# Patient Record
Sex: Female | Born: 1955 | Race: White | Hispanic: No | Marital: Married | Smoking: Never smoker
Health system: Southern US, Community
[De-identification: ages and names within clinical notes are randomized; demographics above are authoritative.]

## PROBLEM LIST (undated history)

## (undated) DIAGNOSIS — I7300 Raynaud's syndrome without gangrene: Secondary | ICD-10-CM

## (undated) DIAGNOSIS — M3500 Sicca syndrome, unspecified: Secondary | ICD-10-CM

## (undated) DIAGNOSIS — Z86018 Personal history of other benign neoplasm: Secondary | ICD-10-CM

## (undated) DIAGNOSIS — K219 Gastro-esophageal reflux disease without esophagitis: Secondary | ICD-10-CM

## (undated) DIAGNOSIS — R42 Dizziness and giddiness: Secondary | ICD-10-CM

## (undated) DIAGNOSIS — D169 Benign neoplasm of bone and articular cartilage, unspecified: Secondary | ICD-10-CM

## (undated) DIAGNOSIS — I10 Essential (primary) hypertension: Secondary | ICD-10-CM

## (undated) HISTORY — PX: GANGLION CYST EXCISION: SHX1691

---
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
## 1984-11-15 HISTORY — PX: TUBAL LIGATION: SHX77

## 1994-11-15 HISTORY — PX: CHOLECYSTECTOMY: SHX55

## 2003-11-16 DIAGNOSIS — Z86018 Personal history of other benign neoplasm: Secondary | ICD-10-CM

## 2003-11-16 HISTORY — DX: Personal history of other benign neoplasm: Z86.018

## 2004-08-27 ENCOUNTER — Ambulatory Visit: Payer: Self-pay | Admitting: Obstetrics and Gynecology

## 2005-09-02 ENCOUNTER — Ambulatory Visit: Payer: Self-pay | Admitting: Obstetrics and Gynecology

## 2005-09-17 ENCOUNTER — Ambulatory Visit: Payer: Self-pay | Admitting: Obstetrics and Gynecology

## 2005-11-11 ENCOUNTER — Ambulatory Visit: Payer: Self-pay | Admitting: Gastroenterology

## 2005-12-13 ENCOUNTER — Ambulatory Visit: Payer: Self-pay | Admitting: Obstetrics and Gynecology

## 2006-06-15 ENCOUNTER — Ambulatory Visit: Payer: Self-pay | Admitting: Family Medicine

## 2006-09-27 ENCOUNTER — Ambulatory Visit: Payer: Self-pay | Admitting: Obstetrics and Gynecology

## 2006-09-29 ENCOUNTER — Ambulatory Visit: Payer: Self-pay | Admitting: Obstetrics and Gynecology

## 2007-10-03 ENCOUNTER — Ambulatory Visit: Payer: Self-pay | Admitting: Family Medicine

## 2008-04-04 DIAGNOSIS — D239 Other benign neoplasm of skin, unspecified: Secondary | ICD-10-CM

## 2008-04-04 HISTORY — DX: Other benign neoplasm of skin, unspecified: D23.9

## 2008-10-03 ENCOUNTER — Ambulatory Visit: Payer: Self-pay | Admitting: Obstetrics and Gynecology

## 2009-10-06 ENCOUNTER — Ambulatory Visit: Payer: Self-pay | Admitting: Obstetrics and Gynecology

## 2010-08-25 ENCOUNTER — Ambulatory Visit: Payer: Self-pay | Admitting: Obstetrics and Gynecology

## 2011-08-26 ENCOUNTER — Ambulatory Visit: Payer: Self-pay | Admitting: Obstetrics and Gynecology

## 2012-08-30 ENCOUNTER — Ambulatory Visit: Payer: Self-pay | Admitting: Obstetrics and Gynecology

## 2013-08-23 ENCOUNTER — Ambulatory Visit: Payer: Self-pay | Admitting: Obstetrics and Gynecology

## 2014-09-03 ENCOUNTER — Ambulatory Visit: Payer: Self-pay | Admitting: Obstetrics and Gynecology

## 2015-09-11 ENCOUNTER — Other Ambulatory Visit: Payer: Self-pay | Admitting: Obstetrics and Gynecology

## 2015-09-11 DIAGNOSIS — Z1231 Encounter for screening mammogram for malignant neoplasm of breast: Secondary | ICD-10-CM

## 2015-09-16 ENCOUNTER — Ambulatory Visit
Admission: RE | Admit: 2015-09-16 | Discharge: 2015-09-16 | Disposition: A | Discharge status: Home / Self Care | Payer: BLUE CROSS/BLUE SHIELD | Source: Ambulatory Visit | Attending: Obstetrics and Gynecology | Admitting: Obstetrics and Gynecology

## 2015-09-16 DIAGNOSIS — Z1231 Encounter for screening mammogram for malignant neoplasm of breast: Secondary | ICD-10-CM | POA: Insufficient documentation

## 2016-09-07 ENCOUNTER — Other Ambulatory Visit: Payer: Self-pay | Admitting: Obstetrics and Gynecology

## 2016-09-07 DIAGNOSIS — Z1231 Encounter for screening mammogram for malignant neoplasm of breast: Secondary | ICD-10-CM

## 2016-09-08 ENCOUNTER — Ambulatory Visit: Payer: BLUE CROSS/BLUE SHIELD

## 2016-09-09 ENCOUNTER — Other Ambulatory Visit: Payer: Self-pay | Admitting: Orthopedic Surgery

## 2016-09-09 DIAGNOSIS — M23312 Other meniscus derangements, anterior horn of medial meniscus, left knee: Secondary | ICD-10-CM

## 2016-09-20 ENCOUNTER — Other Ambulatory Visit: Payer: Self-pay | Admitting: Orthopedic Surgery

## 2016-09-20 DIAGNOSIS — M23312 Other meniscus derangements, anterior horn of medial meniscus, left knee: Secondary | ICD-10-CM

## 2016-09-21 ENCOUNTER — Ambulatory Visit: Payer: BLUE CROSS/BLUE SHIELD

## 2016-09-22 ENCOUNTER — Ambulatory Visit
Admission: RE | Admit: 2016-09-22 | Discharge: 2016-09-22 | Disposition: A | Discharge status: Home / Self Care | Payer: BLUE CROSS/BLUE SHIELD | Source: Ambulatory Visit | Attending: Obstetrics and Gynecology | Admitting: Obstetrics and Gynecology

## 2016-09-22 ENCOUNTER — Ambulatory Visit: Payer: BLUE CROSS/BLUE SHIELD

## 2016-09-22 DIAGNOSIS — Z1231 Encounter for screening mammogram for malignant neoplasm of breast: Secondary | ICD-10-CM | POA: Diagnosis present

## 2016-09-30 ENCOUNTER — Ambulatory Visit: Admission: RE | Admit: 2016-09-30 | Payer: BLUE CROSS/BLUE SHIELD | Source: Ambulatory Visit

## 2016-10-12 ENCOUNTER — Ambulatory Visit: Payer: BLUE CROSS/BLUE SHIELD

## 2016-11-30 ENCOUNTER — Encounter: Payer: Self-pay | Admitting: *Deleted

## 2016-12-03 NOTE — Discharge Instructions (Signed)
Cataract Surgery, Care After °Refer to this sheet in the next few weeks. These instructions provide you with information about caring for yourself after your procedure. Your health care provider may also give you more specific instructions. Your treatment has been planned according to current medical practices, but problems sometimes occur. Call your health care provider if you have any problems or questions after your procedure. °What can I expect after the procedure? °After the procedure, it is common to have: °· Itching. °· Discomfort. °· Fluid discharge. °· Sensitivity to light and to touch. °· Bruising. °Follow these instructions at home: °Eye Care  °· Check your eye every day for signs of infection. Watch for: °¨ Redness, swelling, or pain. °¨ Fluid, blood, or pus. °¨ Warmth. °¨ Bad smell. °Activity  °· Avoid strenuous activities, such as playing contact sports, for as long as told by your health care provider. °· Do not drive or operate heavy machinery until your health care provider approves. °· Do not bend or lift heavy objects . Bending increases pressure in the eye. You can walk, climb stairs, and do light household chores. °· Ask your health care provider when you can return to work. If you work in a dusty environment, you may be advised to wear protective eyewear for a period of time. °General instructions  °· Take or apply over-the-counter and prescription medicines only as told by your health care provider. This includes eye drops. °· Do not touch or rub your eyes. °· If you were given a protective shield, wear it as told by your health care provider. If you were not given a protective shield, wear sunglasses as told by your health care provider to protect your eyes. °· Keep the area around your eye clean and dry. Avoid swimming or allowing water to hit you directly in the face while showering until told by your health care provider. Keep soap and shampoo out of your eyes. °· Do not put a contact lens  into the affected eye or eyes until your health care provider approves. °· Keep all follow-up visits as told by your health care provider. This is important. °Contact a health care provider if: ° °· You have increased bruising around your eye. °· You have pain that is not helped with medicine. °· You have a fever. °· You have redness, swelling, or pain in your eye. °· You have fluid, blood, or pus coming from your incision. °· Your vision gets worse. °Get help right away if: °· You have sudden vision loss. °This information is not intended to replace advice given to you by your health care provider. Make sure you discuss any questions you have with your health care provider. °Document Released: 05/21/2005 Document Revised: 03/11/2016 Document Reviewed: 09/11/2015 °Elsevier Interactive Patient Education © 2017 [employer] ° ° ° ° °General Anesthesia, Adult, Care After °These instructions provide you with information about caring for yourself after your procedure. Your health care provider may also give you more specific instructions. Your treatment has been planned according to current medical practices, but problems sometimes occur. Call your health care provider if you have any problems or questions after your procedure. °What can I expect after the procedure? °After the procedure, it is common to have: °· Vomiting. °· A sore throat. °· Mental slowness. °It is common to feel: °· Nauseous. °· Cold or shivery. °· Sleepy. °· Tired. °· Sore or achy, even in parts of your body where you did not have surgery. °Follow these instructions at   home: °For at least 24 hours after the procedure:  °· Do not: °¨ Participate in activities where you could fall or become injured. °¨ Drive. °¨ Use heavy machinery. °¨ Drink alcohol. °¨ Take sleeping pills or medicines that cause drowsiness. °¨ Make important decisions or sign legal documents. °¨ Take care of children on your own. °· Rest. °Eating and drinking  °· If you vomit, drink  water, juice, or soup when you can drink without vomiting. °· Drink enough fluid to keep your urine clear or pale yellow. °· Make sure you have little or no nausea before eating solid foods. °· Follow the diet recommended by your health care provider. °General instructions  °· Have a responsible adult stay with you until you are awake and alert. °· Return to your normal activities as told by your health care provider. Ask your health care provider what activities are safe for you. °· Take over-the-counter and prescription medicines only as told by your health care provider. °· If you smoke, do not smoke without supervision. °· Keep all follow-up visits as told by your health care provider. This is important. °Contact a health care provider if: °· You continue to have nausea or vomiting at home, and medicines are not helpful. °· You cannot drink fluids or start eating again. °· You cannot urinate after 8-12 hours. °· You develop a skin rash. °· You have fever. °· You have increasing redness at the site of your procedure. °Get help right away if: °· You have difficulty breathing. °· You have chest pain. °· You have unexpected bleeding. °· You feel that you are having a life-threatening or urgent problem. °This information is not intended to replace advice given to you by your health care provider. Make sure you discuss any questions you have with your health care provider. °Document Released: 02/07/2001 Document Revised: 04/05/2016 Document Reviewed: 10/16/2015 °Elsevier Interactive Patient Education © 2017 [employer] ° °

## 2016-12-07 ENCOUNTER — Ambulatory Visit
Admission: RE | Admit: 2016-12-07 | Discharge: 2016-12-07 | Disposition: A | Discharge status: Home / Self Care | Payer: BLUE CROSS/BLUE SHIELD | Source: Ambulatory Visit | Attending: Ophthalmology | Admitting: Ophthalmology

## 2016-12-07 ENCOUNTER — Ambulatory Visit: Payer: BLUE CROSS/BLUE SHIELD | Admitting: Anesthesiology

## 2016-12-07 ENCOUNTER — Encounter
Admission: RE | Disposition: A | Discharge status: Home / Self Care | Payer: Self-pay | Source: Ambulatory Visit | Attending: Ophthalmology

## 2016-12-07 DIAGNOSIS — I10 Essential (primary) hypertension: Secondary | ICD-10-CM | POA: Insufficient documentation

## 2016-12-07 DIAGNOSIS — Z7982 Long term (current) use of aspirin: Secondary | ICD-10-CM | POA: Diagnosis not present

## 2016-12-07 DIAGNOSIS — H2511 Age-related nuclear cataract, right eye: Secondary | ICD-10-CM | POA: Insufficient documentation

## 2016-12-07 DIAGNOSIS — Z79899 Other long term (current) drug therapy: Secondary | ICD-10-CM | POA: Insufficient documentation

## 2016-12-07 HISTORY — DX: Sjogren syndrome, unspecified: M35.00

## 2016-12-07 HISTORY — DX: Gastro-esophageal reflux disease without esophagitis: K21.9

## 2016-12-07 HISTORY — DX: Benign neoplasm of bone and articular cartilage, unspecified: D16.9

## 2016-12-07 HISTORY — DX: Dizziness and giddiness: R42

## 2016-12-07 HISTORY — PX: CATARACT EXTRACTION W/PHACO: SHX586

## 2016-12-07 HISTORY — DX: Essential (primary) hypertension: I10

## 2016-12-07 HISTORY — DX: Raynaud's syndrome without gangrene: I73.00

## 2016-12-07 SURGERY — PHACOEMULSIFICATION, CATARACT, WITH IOL INSERTION
Anesthesia: Monitor Anesthesia Care | Laterality: Right | Wound class: Clean

## 2016-12-07 MED ORDER — SODIUM HYALURONATE 10 MG/ML IO SOLN
INTRAOCULAR | Status: DC | PRN
  Administered 2016-12-07: .55 mL via INTRAOCULAR

## 2016-12-07 MED ORDER — ARMC OPHTHALMIC DILATING DROPS
1.0000 "application " | OPHTHALMIC | Status: DC | PRN
  Administered 2016-12-07 (×3): 1 via OPHTHALMIC

## 2016-12-07 MED ORDER — MOXIFLOXACIN HCL 0.5 % OP SOLN
OPHTHALMIC | Status: DC | PRN
  Administered 2016-12-07: 1 [drp] via OPHTHALMIC

## 2016-12-07 MED ORDER — FENTANYL CITRATE (PF) 100 MCG/2ML IJ SOLN
INTRAMUSCULAR | Status: DC | PRN
  Administered 2016-12-07: 50 ug via INTRAVENOUS

## 2016-12-07 MED ORDER — MIDAZOLAM HCL 2 MG/2ML IJ SOLN
INTRAMUSCULAR | Status: DC | PRN
  Administered 2016-12-07: 2 mg via INTRAVENOUS

## 2016-12-07 MED ORDER — LACTATED RINGERS IV SOLN: INTRAVENOUS | Status: DC

## 2016-12-07 MED ORDER — SODIUM HYALURONATE 23 MG/ML IO SOLN
INTRAOCULAR | Status: DC | PRN
  Administered 2016-12-07: 0.6 mL via INTRAOCULAR

## 2016-12-07 MED ORDER — EPINEPHRINE PF 1 MG/ML IJ SOLN
INTRAOCULAR | Status: DC | PRN
  Administered 2016-12-07: 58 mL via OPHTHALMIC

## 2016-12-07 MED ORDER — LIDOCAINE HCL (PF) 4 % IJ SOLN
INTRAOCULAR | Status: DC | PRN
  Administered 2016-12-07: 1.5 mL via OPHTHALMIC

## 2016-12-07 SURGICAL SUPPLY — 19 items
CANNULA ANT/CHMB 27GA (MISCELLANEOUS) ×3 IMPLANT
CUP MEDICINE 2OZ PLAST GRAD ST (MISCELLANEOUS) ×3 IMPLANT
DISSECTOR HYDRO NUCLEUS 50X22 (MISCELLANEOUS) ×3 IMPLANT
GLOVE BIO SURGEON STRL SZ8 (GLOVE) ×3 IMPLANT
GLOVE SURG LX 7.5 STRW (GLOVE) ×2
GLOVE SURG LX STRL 7.5 STRW (GLOVE) ×1 IMPLANT
GOWN STRL REUS W/ TWL LRG LVL3 (GOWN DISPOSABLE) ×2 IMPLANT
GOWN STRL REUS W/TWL LRG LVL3 (GOWN DISPOSABLE) ×4
LENS IOL TECNIS ITEC 21.0 (Intraocular Lens) ×3 IMPLANT
MARKER SKIN DUAL TIP RULER LAB (MISCELLANEOUS) ×3 IMPLANT
PACK CATARACT (MISCELLANEOUS) ×3 IMPLANT
PACK CATARACT BRASINGTON (MISCELLANEOUS) ×3 IMPLANT
PACK EYE AFTER SURG (MISCELLANEOUS) ×3 IMPLANT
SOL PREP PVP 2OZ (MISCELLANEOUS) ×3
SOLUTION PREP PVP 2OZ (MISCELLANEOUS) ×1 IMPLANT
SYR 3ML LL SCALE MARK (SYRINGE) ×3 IMPLANT
SYR TB 1ML LUER SLIP (SYRINGE) ×3 IMPLANT
WATER STERILE IRR 250ML POUR (IV SOLUTION) ×3 IMPLANT
WIPE NON LINTING 3.25X3.25 (MISCELLANEOUS) ×3 IMPLANT

## 2016-12-07 NOTE — Transfer of Care (Signed)
Immediate Anesthesia Transfer of Care Note  Patient: Joann Baxter  Procedure(s) Performed: Procedure(s): CATARACT EXTRACTION PHACO AND INTRAOCULAR LENS PLACEMENT (IOC)  right (Right)  Patient Location: PACU  Anesthesia Type: MAC  Level of Consciousness: awake, alert  and patient cooperative  Airway and Oxygen Therapy: Patient Spontanous Breathing and Patient connected to supplemental oxygen  Post-op Assessment: Post-op Vital signs reviewed, Patient's Cardiovascular Status Stable, Respiratory Function Stable, Patent Airway and No signs of Nausea or vomiting  Post-op Vital Signs: Reviewed and stable  Complications: No apparent anesthesia complications

## 2016-12-07 NOTE — H&P (Signed)
The History and Physical notes are on paper, have been signed, and are to be scanned. The patient remains stable and unchanged from the H&P.   Previous H&P reviewed, patient examined, and there are no changes.  Joann Baxter 12/07/2016 9:25 AM

## 2016-12-07 NOTE — Anesthesia Preprocedure Evaluation (Addendum)
Anesthesia Evaluation  Patient identified by MRN, date of birth, ID band Patient awake    Reviewed: Allergy & Precautions, H&P , NPO status , Patient's Chart, lab work & pertinent test results, reviewed documented beta blocker date and time   Airway Mallampati: I  TM Distance: >3 FB Neck ROM: full    Dental no notable dental hx.    Pulmonary neg pulmonary ROS,    Pulmonary exam normal breath sounds clear to auscultation       Cardiovascular Exercise Tolerance: Good hypertension, + Peripheral Vascular Disease  Normal cardiovascular exam Rhythm:regular Rate:Normal  Hx of Raynaud's disease   Neuro/Psych negative neurological ROS  negative psych ROS   GI/Hepatic Neg liver ROS, GERD  Controlled,  Endo/Other  negative endocrine ROS  Renal/GU negative Renal ROS  negative genitourinary   Musculoskeletal   Abdominal   Peds  Hematology negative hematology ROS (+)   Anesthesia Other Findings   Reproductive/Obstetrics negative OB ROS                            Anesthesia Physical Anesthesia Plan  ASA: II  Anesthesia Plan: MAC   Post-op Pain Management:    Induction:   Airway Management Planned:   Additional Equipment:   Intra-op Plan:   Post-operative Plan:   Informed Consent: I have reviewed the patients History and Physical, chart, labs and discussed the procedure including the risks, benefits and alternatives for the proposed anesthesia with the patient or authorized representative who has indicated his/her understanding and acceptance.   Dental Advisory Given  Plan Discussed with: CRNA  Anesthesia Plan Comments:        Anesthesia Quick Evaluation

## 2016-12-07 NOTE — Anesthesia Postprocedure Evaluation (Signed)
Anesthesia Post Note  Patient: Joann Baxter  Procedure(s) Performed: Procedure(s) (LRB): CATARACT EXTRACTION PHACO AND INTRAOCULAR LENS PLACEMENT (IOC)  right (Right)  Patient location during evaluation: PACU Anesthesia Type: MAC Level of consciousness: awake and alert Pain management: pain level controlled Vital Signs Assessment: post-procedure vital signs reviewed and stable Respiratory status: spontaneous breathing, nonlabored ventilation and respiratory function stable Cardiovascular status: stable and blood pressure returned to baseline Anesthetic complications: no    Trecia Rogers

## 2016-12-07 NOTE — Anesthesia Procedure Notes (Signed)
Procedure Name: MAC Performed by: Mayme Genta Pre-anesthesia Checklist: Patient identified, Emergency Drugs available, Suction available, Timeout performed and Patient being monitored Patient Re-evaluated:Patient Re-evaluated prior to inductionOxygen Delivery Method: Nasal cannula Placement Confirmation: positive ETCO2

## 2016-12-07 NOTE — Op Note (Signed)
OPERATIVE NOTE  Joann Baxter [id number] 12/07/2016   PREOPERATIVE DIAGNOSIS:  Nuclear sclerotic cataract right eye.  H25.11   POSTOPERATIVE DIAGNOSIS:    Nuclear sclerotic cataract right eye.     PROCEDURE:  Phacoemusification with posterior chamber intraocular lens placement of the right eye   LENS:   Implant Name Type Inv. Item Serial No. Manufacturer Lot No. LRB No. Used  LENS IOL DIOP 21.0 - [id number] Intraocular Lens LENS IOL DIOP 21.0 [id number]   Right 1       PCB00 +21.0   ULTRASOUND TIME: 0 minutes 26 seconds.  CDE 4.56   SURGEON:  Benay Pillow, MD, MPH  ANESTHESIOLOGIST: Anesthesiologist: Rochel Brome, MD CRNA: Mayme Genta, CRNA   ANESTHESIA:  Topical with tetracaine drops augmented with 1% preservative-free intracameral lidocaine.  ESTIMATED BLOOD LOSS: less than 1 mL.   COMPLICATIONS:  None.   DESCRIPTION OF PROCEDURE:  The patient was identified in the holding room and transported to the operating room and placed in the supine position under the operating microscope.  The right eye was identified as the operative eye and it was prepped and draped in the usual sterile ophthalmic fashion.   A 1.0 millimeter clear-corneal paracentesis was made at the 10:30 position. 0.5 ml of preservative-free 1% lidocaine with epinephrine was injected into the anterior chamber.  The anterior chamber was filled with Healon 5 viscoelastic.  A 2.4 millimeter keratome was used to make a near-clear corneal incision at the 8:00 position.  A curvilinear capsulorrhexis was made with a cystotome and capsulorrhexis forceps.  Balanced salt solution was used to hydrodissect and hydrodelineate the nucleus.   Phacoemulsification was then used in stop and chop fashion to remove the lens nucleus and epinucleus.  The remaining cortex was then removed using the irrigation and aspiration handpiece. Healon was then placed into the capsular bag to distend it for lens placement.  A lens was then  injected into the capsular bag.  The remaining viscoelastic was aspirated.   Wounds were hydrated with balanced salt solution.  The anterior chamber was inflated to a physiologic pressure with balanced salt solution.   The rhexis was relatively large but the lens was well centered and positioned at the end of the case.  Intracameral vigamox 0.1 mL undiluted was injected into the eye and a drop placed onto the ocular surface.  No wound leaks were noted.  The patient was taken to the recovery room in stable condition without complications of anesthesia or surgery  Benay Pillow 12/07/2016, 10:05 AM

## 2016-12-08 ENCOUNTER — Encounter: Payer: Self-pay | Admitting: Ophthalmology

## 2016-12-27 ENCOUNTER — Encounter: Payer: Self-pay | Admitting: *Deleted

## 2017-01-06 ENCOUNTER — Ambulatory Visit
Admission: RE | Admit: 2017-01-06 | Discharge: 2017-01-06 | Disposition: A | Discharge status: Home / Self Care | Payer: BLUE CROSS/BLUE SHIELD | Source: Ambulatory Visit | Attending: Ophthalmology | Admitting: Ophthalmology

## 2017-01-06 ENCOUNTER — Ambulatory Visit: Payer: BLUE CROSS/BLUE SHIELD | Admitting: Anesthesiology

## 2017-01-06 ENCOUNTER — Encounter: Payer: Self-pay | Admitting: *Deleted

## 2017-01-06 ENCOUNTER — Encounter
Admission: RE | Disposition: A | Discharge status: Home / Self Care | Payer: Self-pay | Source: Ambulatory Visit | Attending: Ophthalmology

## 2017-01-06 DIAGNOSIS — K219 Gastro-esophageal reflux disease without esophagitis: Secondary | ICD-10-CM | POA: Insufficient documentation

## 2017-01-06 DIAGNOSIS — I739 Peripheral vascular disease, unspecified: Secondary | ICD-10-CM | POA: Insufficient documentation

## 2017-01-06 DIAGNOSIS — H2512 Age-related nuclear cataract, left eye: Secondary | ICD-10-CM | POA: Diagnosis not present

## 2017-01-06 DIAGNOSIS — Z7982 Long term (current) use of aspirin: Secondary | ICD-10-CM | POA: Insufficient documentation

## 2017-01-06 DIAGNOSIS — H52229 Regular astigmatism, unspecified eye: Secondary | ICD-10-CM | POA: Diagnosis not present

## 2017-01-06 DIAGNOSIS — I10 Essential (primary) hypertension: Secondary | ICD-10-CM | POA: Insufficient documentation

## 2017-01-06 DIAGNOSIS — Z79899 Other long term (current) drug therapy: Secondary | ICD-10-CM | POA: Insufficient documentation

## 2017-01-06 HISTORY — PX: CATARACT EXTRACTION W/PHACO: SHX586

## 2017-01-06 SURGERY — PHACOEMULSIFICATION, CATARACT, WITH IOL INSERTION
Anesthesia: Monitor Anesthesia Care | Site: Eye | Laterality: Left | Wound class: Clean

## 2017-01-06 MED ORDER — SODIUM HYALURONATE 10 MG/ML IO SOLN
INTRAOCULAR | Status: AC
  Filled 2017-01-06: qty 0.85

## 2017-01-06 MED ORDER — MOXIFLOXACIN HCL 0.5 % OP SOLN
OPHTHALMIC | Status: DC | PRN
  Administered 2017-01-06: 9 mL via OPHTHALMIC

## 2017-01-06 MED ORDER — SODIUM HYALURONATE 23 MG/ML IO SOLN
INTRAOCULAR | Status: DC | PRN
  Administered 2017-01-06: 0.6 mL via INTRAOCULAR

## 2017-01-06 MED ORDER — SODIUM HYALURONATE 23 MG/ML IO SOLN
INTRAOCULAR | Status: AC
  Filled 2017-01-06: qty 0.6

## 2017-01-06 MED ORDER — SODIUM HYALURONATE 10 MG/ML IO SOLN
INTRAOCULAR | Status: DC | PRN
  Administered 2017-01-06: 0.55 mL via INTRAOCULAR

## 2017-01-06 MED ORDER — MOXIFLOXACIN HCL 0.5 % OP SOLN: 1.0000 [drp] | Freq: Once | OPHTHALMIC | Status: DC

## 2017-01-06 MED ORDER — POVIDONE-IODINE 5 % OP SOLN
OPHTHALMIC | Status: AC
  Filled 2017-01-06: qty 30

## 2017-01-06 MED ORDER — PROPARACAINE HCL 0.5 % OP SOLN
OPHTHALMIC | Status: AC
  Filled 2017-01-06: qty 15

## 2017-01-06 MED ORDER — ARMC OPHTHALMIC DILATING DROPS
1.0000 "application " | OPHTHALMIC | Status: AC
  Administered 2017-01-06 (×3): 1 via OPHTHALMIC

## 2017-01-06 MED ORDER — FENTANYL CITRATE (PF) 100 MCG/2ML IJ SOLN
INTRAMUSCULAR | Status: DC | PRN
  Administered 2017-01-06 (×2): 50 ug via INTRAVENOUS

## 2017-01-06 MED ORDER — BSS IO SOLN
INTRAOCULAR | Status: DC | PRN
  Administered 2017-01-06: 200 mL via INTRAOCULAR

## 2017-01-06 MED ORDER — EPINEPHRINE PF 1 MG/ML IJ SOLN
INTRAMUSCULAR | Status: AC
  Filled 2017-01-06: qty 1

## 2017-01-06 MED ORDER — ARMC OPHTHALMIC DILATING DROPS
OPHTHALMIC | Status: AC
  Filled 2017-01-06: qty 0.4

## 2017-01-06 MED ORDER — LIDOCAINE HCL (PF) 4 % IJ SOLN
INTRAOCULAR | Status: DC | PRN
  Administered 2017-01-06: 4 mL via OPHTHALMIC

## 2017-01-06 MED ORDER — MIDAZOLAM HCL 2 MG/2ML IJ SOLN
INTRAMUSCULAR | Status: AC
  Filled 2017-01-06: qty 2

## 2017-01-06 MED ORDER — MOXIFLOXACIN HCL 0.5 % OP SOLN
OPHTHALMIC | Status: AC
  Filled 2017-01-06: qty 3

## 2017-01-06 MED ORDER — SODIUM CHLORIDE 0.9 % IV SOLN
INTRAVENOUS | Status: DC
  Administered 2017-01-06 (×2): via INTRAVENOUS

## 2017-01-06 MED ORDER — FENTANYL CITRATE (PF) 100 MCG/2ML IJ SOLN
INTRAMUSCULAR | Status: AC
  Filled 2017-01-06: qty 2

## 2017-01-06 MED ORDER — MIDAZOLAM HCL 2 MG/2ML IJ SOLN
INTRAMUSCULAR | Status: DC | PRN
  Administered 2017-01-06 (×2): 1 mg via INTRAVENOUS

## 2017-01-06 SURGICAL SUPPLY — 23 items
CANNULA ANT/CHMB 27GA (MISCELLANEOUS) ×6 IMPLANT
CUP MEDICINE 2OZ PLAST GRAD ST (MISCELLANEOUS) ×3 IMPLANT
DISSECTOR HYDRO NUCLEUS 50X22 (MISCELLANEOUS) ×3 IMPLANT
GLOVE BIO SURGEON STRL SZ8 (GLOVE) ×3 IMPLANT
GLOVE BIOGEL M 6.5 STRL (GLOVE) ×3 IMPLANT
GLOVE SURG LX 7.5 STRW (GLOVE) ×2
GLOVE SURG LX STRL 7.5 STRW (GLOVE) ×1 IMPLANT
GOWN STRL REUS W/ TWL LRG LVL3 (GOWN DISPOSABLE) ×2 IMPLANT
GOWN STRL REUS W/TWL LRG LVL3 (GOWN DISPOSABLE) ×4
LENS IOL TECNIS TRC I 400 24.0 ×1 IMPLANT
LENS IOL TORIC 24.0 ×1 IMPLANT
LENS IOL TORIC 400 24.0 ×1 IMPLANT
PACK CATARACT (MISCELLANEOUS) ×3 IMPLANT
PACK CATARACT KING (MISCELLANEOUS) ×3 IMPLANT
PACK EYE AFTER SURG (MISCELLANEOUS) ×3 IMPLANT
SOL BSS BAG (MISCELLANEOUS) ×3
SOLUTION BSS BAG (MISCELLANEOUS) ×1 IMPLANT
SPEAR PVA EYE SURG (MISCELLANEOUS) ×3 IMPLANT
SYR 3ML LL SCALE MARK (SYRINGE) ×6 IMPLANT
SYR 5ML LL (SYRINGE) ×3 IMPLANT
SYR TB 1ML 27GX1/2 LL (SYRINGE) ×3 IMPLANT
WATER STERILE IRR 250ML POUR (IV SOLUTION) ×3 IMPLANT
WIPE NON LINTING 3.25X3.25 (MISCELLANEOUS) ×3 IMPLANT

## 2017-01-06 NOTE — Discharge Instructions (Signed)
Eye Surgery Discharge Instructions  Expect mild scratchy sensation or mild soreness. DO NOT RUB YOUR EYE!  The day of surgery:  Minimal physical activity, but bed rest is not required  No reading, computer work, or close hand work  No bending, lifting, or straining.  May watch TV  For 24 hours:  No driving, legal decisions, or alcoholic beverages  Safety precautions  Eat anything you prefer: It is better to start with liquids, then soup then solid foods.  _____ Eye patch should be worn until postoperative exam tomorrow.  ____ Solar shield eyeglasses should be worn for comfort in the sunlight/patch while sleeping  Resume all regular medications including aspirin or Coumadin if these were discontinued prior to surgery. You may shower, bathe, shave, or wash your hair. Tylenol may be taken for mild discomfort.  Call your doctor if you experience significant pain, nausea, or vomiting, fever > 101 or other signs of infection. [phone] or [phone] Specific instructions:  Follow-up Information    Benay Pillow, MD Follow up on 01/07/2017.   Specialty:  Ophthalmology Why:  9:35 am Contact information: [address] [place] [address] [phone]

## 2017-01-06 NOTE — Anesthesia Postprocedure Evaluation (Signed)
Anesthesia Post Note  Patient: Joann Baxter  Procedure(s) Performed: Procedure(s) (LRB): CATARACT EXTRACTION PHACO AND INTRAOCULAR LENS PLACEMENT (IOC) (Left)  Patient location during evaluation: PACU Anesthesia Type: MAC Level of consciousness: awake and alert Pain management: pain level controlled Vital Signs Assessment: post-procedure vital signs reviewed and stable Respiratory status: spontaneous breathing, nonlabored ventilation, respiratory function stable and patient connected to nasal cannula oxygen Cardiovascular status: blood pressure returned to baseline and stable Postop Assessment: no signs of nausea or vomiting Anesthetic complications: no     Last Vitals:  Vitals:   01/06/17 0946 01/06/17 1003  BP: (!) 134/94 (!) 143/67  Pulse: 70 70  Resp: 16 16  Temp: 36.3 C     Last Pain:  Vitals:   01/06/17 0946  TempSrc: Temporal                 Precious Haws Piscitello

## 2017-01-06 NOTE — Op Note (Signed)
OPERATIVE NOTE  ISSABEL STENSON [id number] 01/06/2017   PREOPERATIVE DIAGNOSIS:   1.  Nuclear sclerotic cataract left eye.  H25.12 2.  Regular astigmatism.   POSTOPERATIVE DIAGNOSIS:    1.  Nuclear sclerotic cataract left eye.  H25.12 2.  Regular astigmatism.     PROCEDURE:  Phacoemusification with posterior chamber intraocular lens placement of the left eye   LENS:   Implant Name Type Inv. Item Serial No. Manufacturer Lot No. LRB No. Used  Tecnis Toric     [id number]     Left 1       [id number] +24.0 D Toric monofocal lens, left eye.   ULTRASOUND TIME: 0 minutes 26 seconds.  CDE 1.89   SURGEON:  Benay Pillow, MD, MPH   ANESTHESIA:  Topical with tetracaine drops augmented with 1% preservative-free intracameral lidocaine.  ESTIMATED BLOOD LOSS: <1 mL   COMPLICATIONS:  None.   DESCRIPTION OF PROCEDURE:  The patient was identified in the holding room and transported to the operating room and placed in the supine position under the operating microscope.   The patients eye was marked at 0 and 180 with a toric marker with autolevel in the upright position.  The left eye was identified as the operative eye and it was prepped and draped in the usual sterile ophthalmic fashion.  The patient's eye was marked at 052 degrees and 232 with a toric marker.   A 1.0 millimeter clear-corneal paracentesis was made at the 5:00 position. 0.5 ml of preservative-free 1% lidocaine with epinephrine was injected into the anterior chamber.  The anterior chamber was filled with Healon 5 viscoelastic.  A 2.4 millimeter keratome was used to make a near-clear corneal incision at the 2:00 position.  A curvilinear capsulorrhexis was made with a cystotome and capsulorrhexis forceps.  Balanced salt solution was used to hydrodissect and hydrodelineate the nucleus.   Phacoemulsification was then used in stop and chop fashion to remove the lens nucleus and epinucleus.  The remaining cortex was then removed using the  irrigation and aspiration handpiece. Healon was then placed into the capsular bag to distend it for lens placement.  A lens was then injected into the capsular bag.  The lens was rotated into position and carefully aligned with the toric marks.  The remaining viscoelastic was aspirated.   Wounds were hydrated with balanced salt solution.  The anterior chamber was inflated to a physiologic pressure with balanced salt solution.   Intracameral vigamox 0.1 mL undiltued was injected into the eye and a drop placed onto the ocular surface.  No wound leaks were noted.  The patient was taken to the recovery room in stable condition without complications of anesthesia or surgery  Benay Pillow 01/06/2017, 9:44 AM

## 2017-01-06 NOTE — Transfer of Care (Signed)
Immediate Anesthesia Transfer of Care Note  Patient: Joann Baxter  Procedure(s) Performed: Procedure(s) with comments: CATARACT EXTRACTION PHACO AND INTRAOCULAR LENS PLACEMENT (IOC) (Left) - Lot #[id number] H [place]: 00:26.2 AP%: 7.2 CDE: 1.89  Patient Location: PACU  Anesthesia Type:MAC  Level of Consciousness: awake, alert  and oriented  Airway & Oxygen Therapy: Patient Spontanous Breathing  Post-op Assessment: Report given to RN and Post -op Vital signs reviewed and stable  Post vital signs: Reviewed and stable  Last Vitals:  Vitals:   01/06/17 0746  BP: (!) 155/65  Pulse: 73  Resp: 18  Temp: 36.8 C    Last Pain:  Vitals:   01/06/17 0746  TempSrc: Oral         Complications: No apparent anesthesia complications

## 2017-01-06 NOTE — H&P (Signed)
The History and Physical notes are on paper, have been signed, and are to be scanned. The patient remains stable and unchanged from the H&P.   Previous H&P reviewed, patient examined, and there are no changes.  Joann Baxter 01/06/2017 8:57 AM

## 2017-01-06 NOTE — Anesthesia Post-op Follow-up Note (Cosign Needed)
Anesthesia QCDR form completed.        

## 2017-01-06 NOTE — Anesthesia Preprocedure Evaluation (Signed)
Anesthesia Evaluation  Patient identified by MRN, date of birth, ID band Patient awake    Reviewed: Allergy & Precautions, H&P , NPO status , Patient's Chart, lab work & pertinent test results, reviewed documented beta blocker date and time   History of Anesthesia Complications Negative for: history of anesthetic complications  Airway Mallampati: III  TM Distance: <3 FB Neck ROM: limited    Dental no notable dental hx. (+) Poor Dentition, Chipped, Caps   Pulmonary neg pulmonary ROS,    Pulmonary exam normal breath sounds clear to auscultation       Cardiovascular Exercise Tolerance: Good hypertension, + Peripheral Vascular Disease  Normal cardiovascular exam Rhythm:regular Rate:Normal  Hx of Raynaud's disease   Neuro/Psych negative neurological ROS  negative psych ROS   GI/Hepatic Neg liver ROS, GERD  Controlled,  Endo/Other  negative endocrine ROS  Renal/GU negative Renal ROS  negative genitourinary   Musculoskeletal   Abdominal   Peds  Hematology negative hematology ROS (+)   Anesthesia Other Findings Past Medical History: No date: GERD (gastroesophageal reflux disease) No date: Hypertension No date: Osteochondroma     Comment: hips No date: Raynaud's disease No date: Sjogren's syndrome (HCC) No date: Vertigo     Comment: no episodes in 4-5 yrs   Reproductive/Obstetrics negative OB ROS                             Anesthesia Physical  Anesthesia Plan  ASA: III  Anesthesia Plan: MAC   Post-op Pain Management:    Induction:   Airway Management Planned:   Additional Equipment:   Intra-op Plan:   Post-operative Plan:   Informed Consent: I have reviewed the patients History and Physical, chart, labs and discussed the procedure including the risks, benefits and alternatives for the proposed anesthesia with the patient or authorized representative who has indicated his/her  understanding and acceptance.   Dental Advisory Given  Plan Discussed with: CRNA  Anesthesia Plan Comments:         Anesthesia Quick Evaluation

## 2017-06-01 ENCOUNTER — Ambulatory Visit: Payer: BLUE CROSS/BLUE SHIELD | Admitting: Anesthesiology

## 2017-06-01 ENCOUNTER — Encounter
Admission: RE | Disposition: A | Discharge status: Home / Self Care | Payer: Self-pay | Source: Ambulatory Visit | Attending: Unknown Physician Specialty

## 2017-06-01 ENCOUNTER — Ambulatory Visit
Admission: RE | Admit: 2017-06-01 | Discharge: 2017-06-01 | Disposition: A | Discharge status: Home / Self Care | Payer: BLUE CROSS/BLUE SHIELD | Source: Ambulatory Visit | Attending: Unknown Physician Specialty | Admitting: Unknown Physician Specialty

## 2017-06-01 DIAGNOSIS — D122 Benign neoplasm of ascending colon: Secondary | ICD-10-CM | POA: Insufficient documentation

## 2017-06-01 DIAGNOSIS — K640 First degree hemorrhoids: Secondary | ICD-10-CM | POA: Insufficient documentation

## 2017-06-01 DIAGNOSIS — K635 Polyp of colon: Secondary | ICD-10-CM | POA: Diagnosis not present

## 2017-06-01 DIAGNOSIS — Z800 Family history of malignant neoplasm of digestive organs: Secondary | ICD-10-CM | POA: Insufficient documentation

## 2017-06-01 DIAGNOSIS — Z880 Allergy status to penicillin: Secondary | ICD-10-CM | POA: Diagnosis not present

## 2017-06-01 DIAGNOSIS — Z882 Allergy status to sulfonamides status: Secondary | ICD-10-CM | POA: Diagnosis not present

## 2017-06-01 DIAGNOSIS — Z79899 Other long term (current) drug therapy: Secondary | ICD-10-CM | POA: Insufficient documentation

## 2017-06-01 DIAGNOSIS — I10 Essential (primary) hypertension: Secondary | ICD-10-CM | POA: Insufficient documentation

## 2017-06-01 DIAGNOSIS — I7300 Raynaud's syndrome without gangrene: Secondary | ICD-10-CM | POA: Diagnosis not present

## 2017-06-01 DIAGNOSIS — K219 Gastro-esophageal reflux disease without esophagitis: Secondary | ICD-10-CM | POA: Diagnosis not present

## 2017-06-01 DIAGNOSIS — Z1211 Encounter for screening for malignant neoplasm of colon: Secondary | ICD-10-CM | POA: Diagnosis present

## 2017-06-01 DIAGNOSIS — M3500 Sicca syndrome, unspecified: Secondary | ICD-10-CM | POA: Diagnosis not present

## 2017-06-01 DIAGNOSIS — Z7982 Long term (current) use of aspirin: Secondary | ICD-10-CM | POA: Insufficient documentation

## 2017-06-01 DIAGNOSIS — Z6835 Body mass index (BMI) 35.0-35.9, adult: Secondary | ICD-10-CM | POA: Diagnosis not present

## 2017-06-01 DIAGNOSIS — E669 Obesity, unspecified: Secondary | ICD-10-CM | POA: Insufficient documentation

## 2017-06-01 HISTORY — PX: COLONOSCOPY WITH PROPOFOL: SHX5780

## 2017-06-01 SURGERY — COLONOSCOPY WITH PROPOFOL
Anesthesia: General

## 2017-06-01 MED ORDER — PROPOFOL 10 MG/ML IV BOLUS
INTRAVENOUS | Status: DC | PRN
  Administered 2017-06-01: 60 mg via INTRAVENOUS

## 2017-06-01 MED ORDER — LIDOCAINE HCL (CARDIAC) 20 MG/ML IV SOLN
INTRAVENOUS | Status: DC | PRN
  Administered 2017-06-01: 60 mg via INTRAVENOUS

## 2017-06-01 MED ORDER — PROPOFOL 500 MG/50ML IV EMUL
INTRAVENOUS | Status: DC | PRN
  Administered 2017-06-01: 120 ug/kg/min via INTRAVENOUS

## 2017-06-01 MED ORDER — PROPOFOL 500 MG/50ML IV EMUL
INTRAVENOUS | Status: AC
  Filled 2017-06-01: qty 50

## 2017-06-01 MED ORDER — SODIUM CHLORIDE 0.9 % IV SOLN
INTRAVENOUS | Status: DC
  Administered 2017-06-01: 15:00:00 via INTRAVENOUS

## 2017-06-01 MED ORDER — SODIUM CHLORIDE 0.9 % IV SOLN: INTRAVENOUS | Status: DC

## 2017-06-01 MED ORDER — LIDOCAINE HCL (PF) 2 % IJ SOLN
INTRAMUSCULAR | Status: AC
  Filled 2017-06-01: qty 2

## 2017-06-01 NOTE — Op Note (Signed)
[facility] Gastroenterology Patient Name: Joann Baxter Procedure Date: 06/01/2017 3:17 PM MRN: [id number] Account #: [id number] Date of Birth: 11/05/1956 Admit Type: Outpatient Age: 61 Room: [facility] ENDO ROOM 3 Gender: Female Note Status: Finalized Procedure:            Colonoscopy Providers:            Manya Silvas, MD Referring MD:         Youlanda Roys. Lovie Macadamia, MD (Referring MD) Medicines:            Propofol per Anesthesia Complications:        No immediate complications. Procedure:            Pre-Anesthesia Assessment:                       - After reviewing the risks and benefits, the patient                        was deemed in satisfactory condition to undergo the                        procedure.                       After obtaining informed consent, the colonoscope was                        passed under direct vision. Throughout the procedure,                        the patient's blood pressure, pulse, and oxygen                        saturations were monitored continuously. The Olympus                        PCF-H180AL colonoscope ( S#: [id number] ) was introduced                        through the anus and advanced to the the cecum,                        identified by appendiceal orifice and ileocecal valve.                        The colonoscopy was performed without difficulty. The                        patient tolerated the procedure well. The quality of                        the bowel preparation was excellent. Findings:      Three sessile polyps were found in the rectum, sigmoid colon, ascending       colon and distal ascending colon. The polyps were diminutive in size.       These polyps were removed with a jumbo cold forceps. Resection and       retrieval were complete.      Internal hemorrhoids were found during endoscopy. The hemorrhoids were       small and Grade I (internal hemorrhoids that do not prolapse).  The exam was otherwise  without abnormality. Impression:           - Three diminutive polyps in the rectum, in the sigmoid                        colon, in the ascending colon and in the distal                        ascending colon, removed with a jumbo cold forceps.                        Resected and retrieved.                       - Internal hemorrhoids.                       - The examination was otherwise normal. Recommendation:       - Await pathology results. Manya Silvas, MD 06/01/2017 3:56:39 PM This report has been signed electronically. Number of Addenda: 0 Note Initiated On: 06/01/2017 3:17 PM Scope Withdrawal Time: 0 hours 14 minutes 7 seconds  Total Procedure Duration: 0 hours 22 minutes 52 seconds       [facility]

## 2017-06-01 NOTE — Anesthesia Post-op Follow-up Note (Cosign Needed)
Anesthesia QCDR form completed.        

## 2017-06-01 NOTE — Transfer of Care (Addendum)
Immediate Anesthesia Transfer of Care Note  Patient: Joann Baxter  Procedure(s) Performed: Procedure(s): COLONOSCOPY WITH PROPOFOL (N/A)  Patient Location: PACU  Anesthesia Type:General  Level of Consciousness: sedated  Airway & Oxygen Therapy: Patient Spontanous Breathing and Patient connected to nasal cannula oxygen  Post-op Assessment: Report given to RN and Post -op Vital signs reviewed and stable  Post vital signs: Reviewed and stable  Last Vitals:  Vitals:   06/01/17 1600 06/01/17 1610  BP: (!) 95/52 112/70  Pulse: 71 78  Resp: 20 16  Temp: 36.4 C     Last Pain:  Vitals:   06/01/17 1410  TempSrc: Tympanic         Complications: No apparent anesthesia complications

## 2017-06-01 NOTE — Anesthesia Preprocedure Evaluation (Signed)
Anesthesia Evaluation  Patient identified by MRN, date of birth, ID band Patient awake    Reviewed: Allergy & Precautions, NPO status , Patient's Chart, lab work & pertinent test results  History of Anesthesia Complications Negative for: history of anesthetic complications  Airway Mallampati: I  TM Distance: >3 FB Neck ROM: Full    Dental no notable dental hx.    Pulmonary neg pulmonary ROS, neg sleep apnea, neg COPD,    breath sounds clear to auscultation- rhonchi (-) wheezing      Cardiovascular hypertension, Pt. on medications (-) CAD, (-) Past MI and (-) Cardiac Stents  Rhythm:Regular Rate:Normal - Systolic murmurs and - Diastolic murmurs    Neuro/Psych negative neurological ROS  negative psych ROS   GI/Hepatic Neg liver ROS, GERD  ,  Endo/Other  negative endocrine ROSneg diabetes  Renal/GU negative Renal ROS     Musculoskeletal negative musculoskeletal ROS (+)   Abdominal (+) + obese,   Peds  Hematology negative hematology ROS (+)   Anesthesia Other Findings Past Medical History: No date: GERD (gastroesophageal reflux disease) No date: Hypertension No date: Osteochondroma     Comment:  hips No date: Raynaud's disease No date: Sjogren's syndrome (HCC) No date: Vertigo     Comment:  no episodes in 4-5 yrs   Reproductive/Obstetrics                             Anesthesia Physical Anesthesia Plan  ASA: II  Anesthesia Plan: General   Post-op Pain Management:    Induction: Intravenous  PONV Risk Score and Plan: 2 and Propofol  Airway Management Planned: Natural Airway  Additional Equipment:   Intra-op Plan:   Post-operative Plan:   Informed Consent: I have reviewed the patients History and Physical, chart, labs and discussed the procedure including the risks, benefits and alternatives for the proposed anesthesia with the patient or authorized representative who has  indicated his/her understanding and acceptance.   Dental advisory given  Plan Discussed with: CRNA and Anesthesiologist  Anesthesia Plan Comments:         Anesthesia Quick Evaluation

## 2017-06-01 NOTE — H&P (Signed)
Primary Care Physician:  Juluis Pitch, MD Primary Gastroenterologist:  Dr. Vira Agar  Pre-Procedure History & Physical: HPI:  Girl Schissler Lenhoff is a 61 y.o. female is here for an colonoscopy.   Past Medical History:  Diagnosis Date  . GERD (gastroesophageal reflux disease)   . Hypertension   . Osteochondroma    hips  . Raynaud's disease   . Sjogren's syndrome ([place])   . Vertigo    no episodes in 4-5 yrs    Past Surgical History:  Procedure Laterality Date  . CATARACT EXTRACTION W/PHACO Right 12/07/2016   Procedure: CATARACT EXTRACTION PHACO AND INTRAOCULAR LENS PLACEMENT ([place])  right;  Surgeon: Eulogio Bear, MD;  Location: [facility];  Service: Ophthalmology;  Laterality: Right;  . CATARACT EXTRACTION W/PHACO Left 01/06/2017   Procedure: CATARACT EXTRACTION PHACO AND INTRAOCULAR LENS PLACEMENT ([facility]);  Surgeon: Eulogio Bear, MD;  Location: [facility];  Service: Ophthalmology;  Laterality: Left;  Lot #[id number] H [place]: 00:26.2 AP%: 7.2 CDE: 1.89  . CHOLECYSTECTOMY  1996  . GANGLION CYST EXCISION Left 1972 or 1973   foot  . TUBAL LIGATION  1986    Prior to Admission medications   Medication Sig Start Date End Date Taking? Authorizing Provider  amLODipine (NORVASC) 2.5 MG tablet Take 2.5 mg by mouth every evening.   Yes [provider]  lisinopril (PRINIVIL,ZESTRIL) 40 MG tablet Take 40 mg by mouth every evening.    Yes [provider]  omeprazole (PRILOSEC OTC) 20 MG tablet Take 20 mg by mouth daily as needed (heartburn).   Yes [provider]  Triamcinolone Acetonide (NASACORT ALLERGY 24HR NA) Place 2 sprays into the nose at bedtime as needed (allergies).    Yes [provider]  Ascorbic Acid (VITAMIN C) 500 MG CAPS Take 500 mg by mouth daily.     [provider]  aspirin 81 MG tablet Take 81 mg by mouth every evening.     [provider]  Calcium Carbonate-Vitamin D (CALCIUM 500 + D PO) Take 1 tablet by mouth 2  (two) times daily with a meal.     [provider]  fexofenadine (ALLEGRA) 180 MG tablet Take 180 mg by mouth daily.    [provider]  Glucosamine-Chondroit-Vit C-Mn (GLUCOSAMINE CHONDR 1500 COMPLX PO) Take 2 capsules by mouth every evening.     [provider]  Multiple Vitamin (MULTIVITAMIN) tablet Take 1 tablet by mouth daily.    [provider]  Omega-3 Fatty Acids (FISH OIL) 1200 MG CAPS Take 1 capsule by mouth 2 (two) times daily with breakfast and lunch.     [provider]    Allergies as of 05/30/2017 - Review Complete 01/06/2017  Allergen Reaction Noted  . Augmentin [amoxicillin-pot clavulanate] Hives and Rash 11/30/2016  . Sulfa antibiotics Rash 11/30/2016    Family History  Problem Relation Age of Onset  . Hypertension Mother   . Colon cancer Father   . Multiple sclerosis Sister   . Diabetes Brother   . Colon cancer Brother   . Hypertension Brother   . Breast cancer Neg Hx     Social History   Social History  . Marital status: Married    Spouse name: N/A  . Number of children: N/A  . Years of education: N/A   Occupational History  . Not on file.   Social History Main Topics  . Smoking status: Never Smoker  . Smokeless tobacco: Never Used  . Alcohol use No  .  Drug use: No  . Sexual activity: Not on file   Other Topics Concern  . Not on file   Social History Narrative  . No narrative on file    Review of Systems: See HPI, otherwise negative ROS  Physical Exam: BP (!) 138/59   Pulse 76   Temp 98 F (36.7 C) (Tympanic)   Resp 20   Ht 5' (1.524 m)   Wt 82.6 kg (182 lb)   SpO2 98%   BMI 35.54 kg/m  General:   Alert,  pleasant and cooperative in NAD Head:  Normocephalic and atraumatic. Neck:  Supple; no masses or thyromegaly. Lungs:  Clear throughout to auscultation.    Heart:  Regular rate and rhythm. Abdomen:  Soft, nontender and nondistended. Normal bowel sounds, without guarding, and without  rebound.   Neurologic:  Alert and  oriented x4;  grossly normal neurologically.  Impression/Plan: Griselda G Sze is here for an colonoscopy to be performed for FH colon cancer in brother.  Risks, benefits, limitations, and alternatives regarding  colonoscopy have been reviewed with the patient.  Questions have been answered.  All parties agreeable.   Gaylyn Cheers, MD  06/01/2017, 3:24 PM

## 2017-06-01 NOTE — Anesthesia Postprocedure Evaluation (Signed)
Anesthesia Post Note  Patient: Myishia G Demartin  Procedure(s) Performed: Procedure(s) (LRB): COLONOSCOPY WITH PROPOFOL (N/A)  Patient location during evaluation: Endoscopy Anesthesia Type: General Level of consciousness: awake and alert and oriented Pain management: pain level controlled Vital Signs Assessment: post-procedure vital signs reviewed and stable Respiratory status: spontaneous breathing, nonlabored ventilation and respiratory function stable Cardiovascular status: blood pressure returned to baseline and stable Postop Assessment: no signs of nausea or vomiting Anesthetic complications: no     Last Vitals:  Vitals:   06/01/17 1620 06/01/17 1630  BP: 132/72 (!) 146/75  Pulse: 69 79  Resp: 19 14  Temp:      Last Pain:  Vitals:   06/01/17 1410  TempSrc: Tympanic                 Charna Neeb

## 2017-06-02 ENCOUNTER — Encounter: Payer: Self-pay | Admitting: Unknown Physician Specialty

## 2017-06-03 LAB — SURGICAL PATHOLOGY

## 2017-09-15 ENCOUNTER — Other Ambulatory Visit: Payer: Self-pay | Admitting: Obstetrics and Gynecology

## 2017-09-15 DIAGNOSIS — Z1231 Encounter for screening mammogram for malignant neoplasm of breast: Secondary | ICD-10-CM

## 2017-09-29 ENCOUNTER — Ambulatory Visit
Admission: RE | Admit: 2017-09-29 | Discharge: 2017-09-29 | Disposition: A | Discharge status: Home / Self Care | Payer: BLUE CROSS/BLUE SHIELD | Source: Ambulatory Visit | Attending: Obstetrics and Gynecology | Admitting: Obstetrics and Gynecology

## 2017-09-29 DIAGNOSIS — Z1231 Encounter for screening mammogram for malignant neoplasm of breast: Secondary | ICD-10-CM

## 2018-09-26 ENCOUNTER — Other Ambulatory Visit: Payer: Self-pay | Admitting: Obstetrics and Gynecology

## 2018-09-26 DIAGNOSIS — Z1231 Encounter for screening mammogram for malignant neoplasm of breast: Secondary | ICD-10-CM

## 2018-10-04 ENCOUNTER — Ambulatory Visit
Admission: RE | Admit: 2018-10-04 | Discharge: 2018-10-04 | Disposition: A | Discharge status: Home / Self Care | Payer: BLUE CROSS/BLUE SHIELD | Source: Ambulatory Visit | Attending: Obstetrics and Gynecology | Admitting: Obstetrics and Gynecology

## 2018-10-04 DIAGNOSIS — Z1231 Encounter for screening mammogram for malignant neoplasm of breast: Secondary | ICD-10-CM | POA: Diagnosis not present

## 2019-08-24 ENCOUNTER — Other Ambulatory Visit: Payer: Self-pay | Admitting: Obstetrics and Gynecology

## 2019-08-24 DIAGNOSIS — Z1231 Encounter for screening mammogram for malignant neoplasm of breast: Secondary | ICD-10-CM

## 2019-10-08 ENCOUNTER — Ambulatory Visit
Admission: RE | Admit: 2019-10-08 | Discharge: 2019-10-08 | Disposition: A | Discharge status: Home / Self Care | Payer: BC Managed Care – PPO | Source: Ambulatory Visit | Attending: Obstetrics and Gynecology | Admitting: Obstetrics and Gynecology

## 2019-10-08 DIAGNOSIS — Z1231 Encounter for screening mammogram for malignant neoplasm of breast: Secondary | ICD-10-CM

## 2020-03-10 ENCOUNTER — Ambulatory Visit: Payer: BC Managed Care – PPO | Admitting: Dermatology

## 2020-09-08 ENCOUNTER — Other Ambulatory Visit: Payer: Self-pay | Admitting: Obstetrics and Gynecology

## 2020-09-08 DIAGNOSIS — Z1231 Encounter for screening mammogram for malignant neoplasm of breast: Secondary | ICD-10-CM

## 2020-10-15 ENCOUNTER — Other Ambulatory Visit: Payer: Self-pay

## 2020-10-15 ENCOUNTER — Ambulatory Visit
Admission: RE | Admit: 2020-10-15 | Discharge: 2020-10-15 | Disposition: A | Discharge status: Home / Self Care | Payer: BC Managed Care – PPO | Source: Ambulatory Visit | Attending: Obstetrics and Gynecology | Admitting: Obstetrics and Gynecology

## 2020-10-15 DIAGNOSIS — Z1231 Encounter for screening mammogram for malignant neoplasm of breast: Secondary | ICD-10-CM | POA: Insufficient documentation

## 2020-10-28 ENCOUNTER — Encounter: Payer: BC Managed Care – PPO | Admitting: Dermatology

## 2020-11-19 ENCOUNTER — Ambulatory Visit: Payer: BC Managed Care – PPO | Admitting: Dermatology

## 2020-11-19 ENCOUNTER — Encounter: Payer: Self-pay | Admitting: Dermatology

## 2020-11-19 ENCOUNTER — Other Ambulatory Visit: Payer: Self-pay

## 2020-11-19 DIAGNOSIS — L853 Xerosis cutis: Secondary | ICD-10-CM

## 2020-11-19 DIAGNOSIS — Z86018 Personal history of other benign neoplasm: Secondary | ICD-10-CM

## 2020-11-19 DIAGNOSIS — D2262 Melanocytic nevi of left upper limb, including shoulder: Secondary | ICD-10-CM | POA: Diagnosis not present

## 2020-11-19 DIAGNOSIS — Z1283 Encounter for screening for malignant neoplasm of skin: Secondary | ICD-10-CM

## 2020-11-19 DIAGNOSIS — L821 Other seborrheic keratosis: Secondary | ICD-10-CM

## 2020-11-19 DIAGNOSIS — D229 Melanocytic nevi, unspecified: Secondary | ICD-10-CM

## 2020-11-19 DIAGNOSIS — D2372 Other benign neoplasm of skin of left lower limb, including hip: Secondary | ICD-10-CM

## 2020-11-19 DIAGNOSIS — D239 Other benign neoplasm of skin, unspecified: Secondary | ICD-10-CM

## 2020-11-19 DIAGNOSIS — I8311 Varicose veins of right lower extremity with inflammation: Secondary | ICD-10-CM

## 2020-11-19 DIAGNOSIS — D1800 Hemangioma unspecified site: Secondary | ICD-10-CM

## 2020-11-19 DIAGNOSIS — D225 Melanocytic nevi of trunk: Secondary | ICD-10-CM | POA: Diagnosis not present

## 2020-11-19 DIAGNOSIS — L720 Epidermal cyst: Secondary | ICD-10-CM

## 2020-11-19 DIAGNOSIS — L814 Other melanin hyperpigmentation: Secondary | ICD-10-CM

## 2020-11-19 DIAGNOSIS — D2272 Melanocytic nevi of left lower limb, including hip: Secondary | ICD-10-CM | POA: Diagnosis not present

## 2020-11-19 DIAGNOSIS — L578 Other skin changes due to chronic exposure to nonionizing radiation: Secondary | ICD-10-CM

## 2020-11-19 MED ORDER — MOMETASONE FUROATE 0.1 % EX CREA: TOPICAL_CREAM | CUTANEOUS | 1 refills | Status: AC

## 2020-11-19 NOTE — Progress Notes (Signed)
Follow-Up Visit   Subjective  Joann Baxter is a 65 y.o. female who presents for the following: Annual Exam (Here for annual skin cancer screening. Hx of DN in the past. Has spot on right medial ankle would like checked. Dur: 6-9 months. Was tender to touch when first appeared. Changed colors. Not as sensitive now.).  +Hx of PAD.    The following portions of the chart were reviewed this encounter and updated as appropriate:      Review of Systems: No other skin or systemic complaints except as noted in HPI or Assessment and Plan.  Objective  Well appearing patient in no apparent distress; mood and affect are within normal limits.  A full examination was performed including scalp, head, eyes, ears, nose, lips, neck, chest, axillae, abdomen, back, buttocks, bilateral upper extremities, bilateral lower extremities, hands, feet, fingers, toes, fingernails, and toenails. All findings within normal limits unless otherwise noted below.  Objective  Left upper arm/shoulder, right medial buttock, left spinal lower back, right lateral mid back, right knee, left lower hip:  5x55mm brown papule at left upper arm/shoulder.  43mm medium dark brown papule at right medial buttock.  97mm medium brown papule at right lateral mid back.  47mm medium dark brown macule at right knee.  7x60mm medium brown macule at left lower hip  38mm brown macule with notch at left pretibial   Objective  Right medial ankle:  2.5cm light erythematous firm smooth depressed plaque, nontender Pitting edema at B/L lower legs, small varicosities at B/L lower legs  Objective  left upper pretibia: Firm pink/brown papulenodule with dimple sign in area of previous biosy site.   Objective  arms, legs, torso: Xerosis - diffuse xerotic patches   Objective  Right Antecubital Fossa: 97mm Subcutaneous nodule with mild erythema.   Objective  Left upper calf: 49mm brown papule slightly waxy at left upper calf  Assessment  & Plan  Nevus Left upper arm/shoulder, right medial buttock, left spinal lower back, right lateral mid back, right knee, left lower hip  Benign-appearing.  Observation.  Call clinic for new or changing moles.  Recommend daily use of broad spectrum spf 30+ sunscreen to sun-exposed areas.    ABCDEs of mole observation discussed.  RTC if any changes noted.     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  Resume diuretic as directed when legs are swelling.   Start Mometasone cream QD-BID to aa R med ankle up to one month and prn symptoms  If not improving plan Ultrasound therapy and referral to [facility]. Patient will c/b with status update and if would like to pursue PT U/S therapy  Topical steroids (such as triamcinolone, fluocinolone, fluocinonide, mometasone, clobetasol, halobetasol, betamethasone, hydrocortisone) can cause thinning and lightening of the skin if they are used for too long in the same area. Your physician has selected the right strength medicine for your problem and area affected on the body. Please use your medication only as directed by your physician to prevent side effects.    Dr Gwen Pounds also examined patient and agreed with diagnosis and treatment.  mometasone (ELOCON) 0.1 % cream - Right medial ankle  Dermatofibroma left upper pretibia  Vs scar. Benign, observe.    Xerosis cutis arms, legs, torso  Recommend Amlactin Rapid Relief lotion/cream, Gold Bond Rough and Bumpy, or CeraVe SA cream.  Dove BW for sensitive skin.  Recommend  mild soap and moisturizing cream 1-2 times daily.  recommend gentle, hydrating skin care gentle skin care handout given   Epidermal inclusion cyst Right Antecubital Fossa  Inflamed. Benign-appearing. Exam most consistent with an epidermal inclusion cyst. Discussed that a cyst  is a benign growth that can grow over time and sometimes get irritated or inflamed. Recommend observation if it is not bothersome. Discussed option of surgical excision to remove it if it is growing, symptomatic, or other changes noted. Please call for new or changing lesions so they can be evaluated.    Can use Mometasone here BID PRN symptoms.  Seborrheic keratosis Left upper calf  Benign, observe.     Lentigines - Scattered tan macules - Discussed due to sun exposure - Benign, observe - Call for any changes  Seborrheic Keratoses - Stuck-on, waxy, tan-brown papules and plaques  - Discussed benign etiology and prognosis. - Observe - Call for any changes  Melanocytic Nevi - Tan-brown and/or pink-flesh-colored symmetric macules and papules - Benign appearing on exam today - Observation - Call clinic for new or changing moles - Recommend daily use of broad spectrum spf 30+ sunscreen to sun-exposed areas.   Hemangiomas - Red papules - Discussed benign nature - Observe - Call for any changes  Actinic Damage - Chronic, secondary to cumulative UV/sun exposure - diffuse scaly erythematous macules with underlying dyspigmentation - Recommend daily broad spectrum sunscreen SPF 30+ to sun-exposed areas, reapply every 2 hours as needed.  - Call for new or changing lesions.  Skin cancer screening performed today.  History of Dysplastic Nevi - No evidence of recurrence today at right back, mid back, left upper thigh, left spinal lower back. - Recommend regular full body skin exams - Recommend daily broad spectrum sunscreen SPF 30+ to sun-exposed areas, reapply every 2 hours as needed.  - Call if any new or changing lesions are noted between office visits   Return in about 1 year (around 11/19/2021) for annual skin check, sooner if leg flares.   I, Emelia Salisbury, CMA, am acting as scribe for Brendolyn Patty, MD.  Documentation: I have reviewed the above documentation for accuracy and  completeness, and I agree with the above.  Brendolyn Patty MD

## 2020-11-19 NOTE — Patient Instructions (Addendum)
Melanoma ABCDEs  Melanoma is the most dangerous type of skin cancer, and is the leading cause of death from skin disease.  You are more likely to develop melanoma if you:  Have light-colored skin, light-colored eyes, or red or blond hair  Spend a lot of time in the sun  Tan regularly, either outdoors or in a tanning bed  Have had blistering sunburns, especially during childhood  Have a close family member who has had a melanoma  Have atypical moles or large birthmarks  Early detection of melanoma is key since treatment is typically straightforward and cure rates are extremely high if we catch it early.   The first sign of melanoma is often a change in a mole or a new dark spot.  The ABCDE system is a way of remembering the signs of melanoma.  A for asymmetry:  The two halves do not match. B for border:  The edges of the growth are irregular. C for color:  A mixture of colors are present instead of an even brown color. D for diameter:  Melanomas are usually (but not always) greater than 65mm - the size of a pencil eraser. E for evolution:  The spot keeps changing in size, shape, and color.  Please check your skin once per month between visits. You can use a small mirror in front and a large mirror behind you to keep an eye on the back side or your body.   If you see any new or changing lesions before your next follow-up, please call to schedule a visit.  Please continue daily skin protection including broad spectrum sunscreen SPF 30+ to sun-exposed areas, reapplying every 2 hours as needed when you're outdoors.   Topical steroids (such as triamcinolone, fluocinolone, fluocinonide, mometasone, clobetasol, halobetasol, betamethasone, hydrocortisone) can cause thinning and lightening of the skin if they are used for too long in the same area. Your physician has selected the right strength medicine for your problem and area affected on the body. Please use your medication only as directed  by your physician to prevent side effects.   Recommend Amlactin Rapid Relief lotion/cream, Gold Bond Rough and Bumpy or CeraVe SA cream  Gentle Skin Care Guide  1. Bathe no more than once a day.  2. Avoid bathing in hot water  3. Use a mild soap like Dove, Vanicream, Cetaphil, CeraVe. Can use Lever 2000 or Cetaphil antibacterial soap  4. Use soap only where you need it. On most days, use it under your arms, between your legs, and on your feet. Let the water rinse other areas unless visibly dirty.  5. When you get out of the bath/shower, use a towel to gently blot your skin dry, don't rub it.  6. While your skin is still a little damp, apply a moisturizing cream such as Vanicream, CeraVe, Cetaphil, Eucerin, Sarna lotion or plain Vaseline Jelly. For hands apply Neutrogena [place] Hand Cream or Excipial Hand Cream.  7. Reapply moisturizer any time you start to itch or feel dry.  8. Sometimes using free and clear laundry detergents can be helpful. Fabric softener sheets should be avoided. Downy Free & Gentle liquid, or any liquid fabric softener that is free of dyes and perfumes, it acceptable to use  9. If your doctor has given you prescription creams you may apply moisturizers over them   Lipodermatosclerosis:  Continue compression stockings daily Use Mometasone cream once or twice daily as needed for symptoms.

## 2021-10-21 IMAGING — MG DIGITAL SCREENING BILAT W/ TOMO W/ CAD
8 series · 8 of 24 positions shown · non-contrast
Comparison: Previous exam(s).

CLINICAL DATA: Screening.

EXAM:
DIGITAL SCREENING BILATERAL MAMMOGRAM WITH TOMO AND CAD

[L MLO synth-2D]
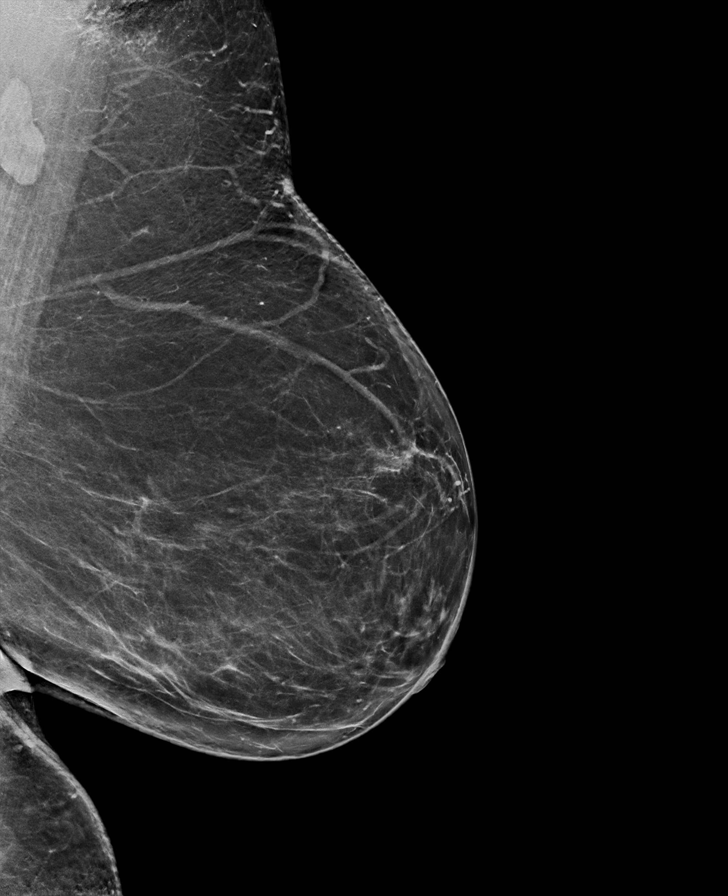

[L CC synth-2D]
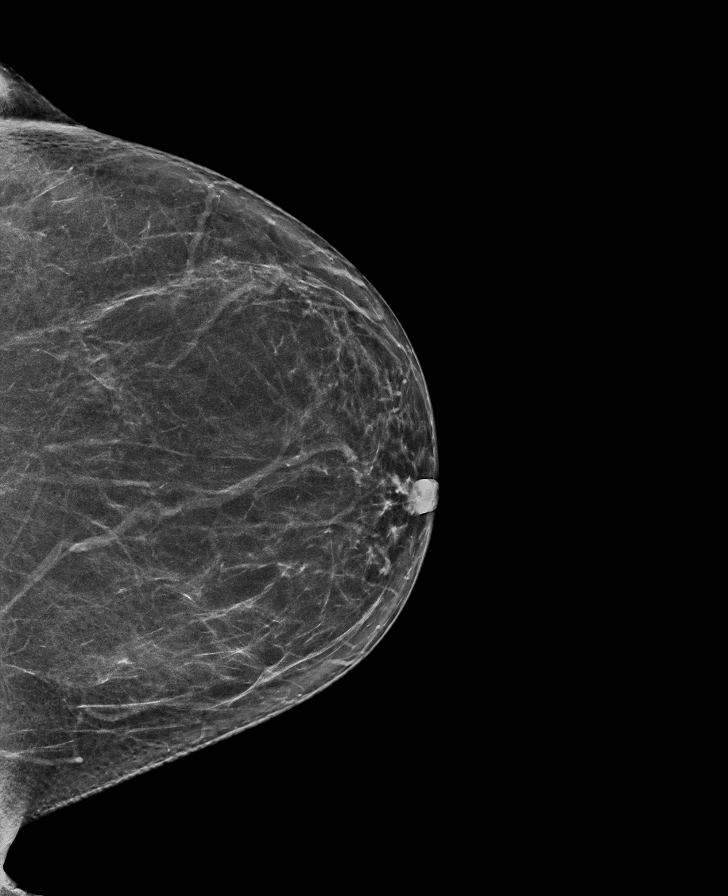

[R CC synth-2D]
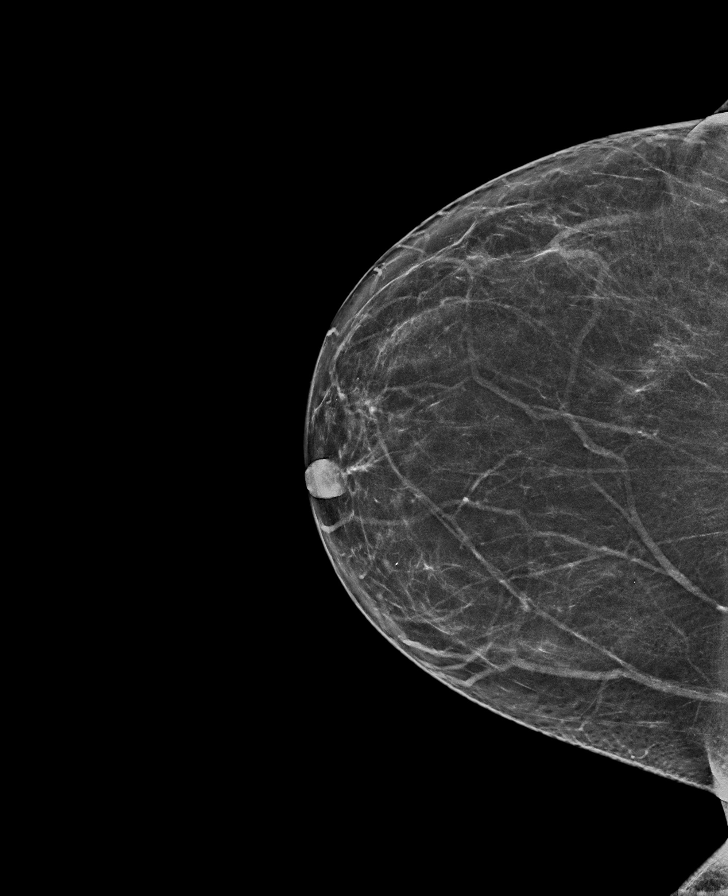

[R MLO synth-2D]
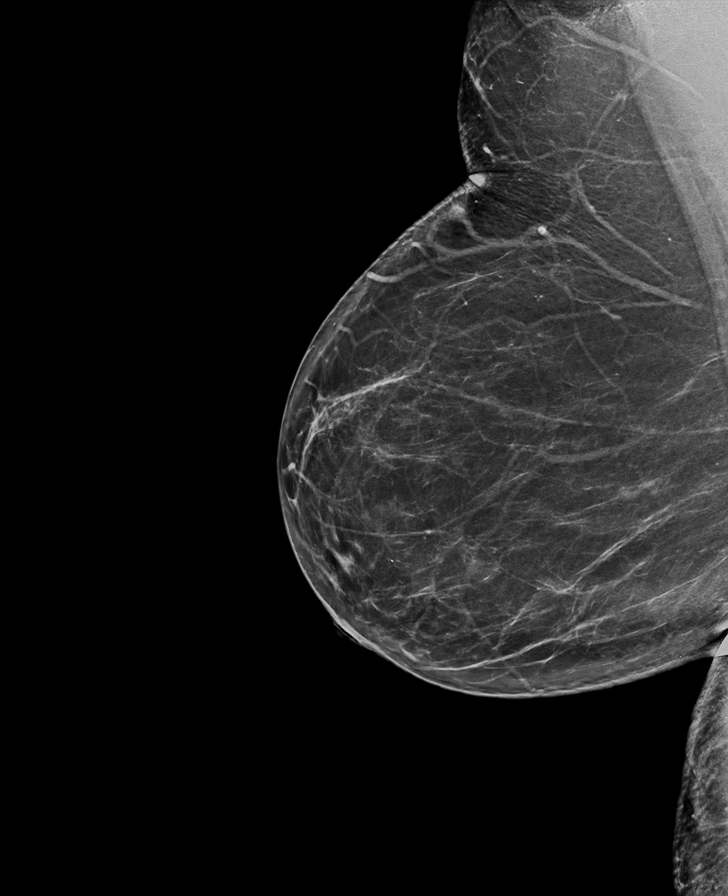

[L CC tomo · tomo slice 37/72.0]
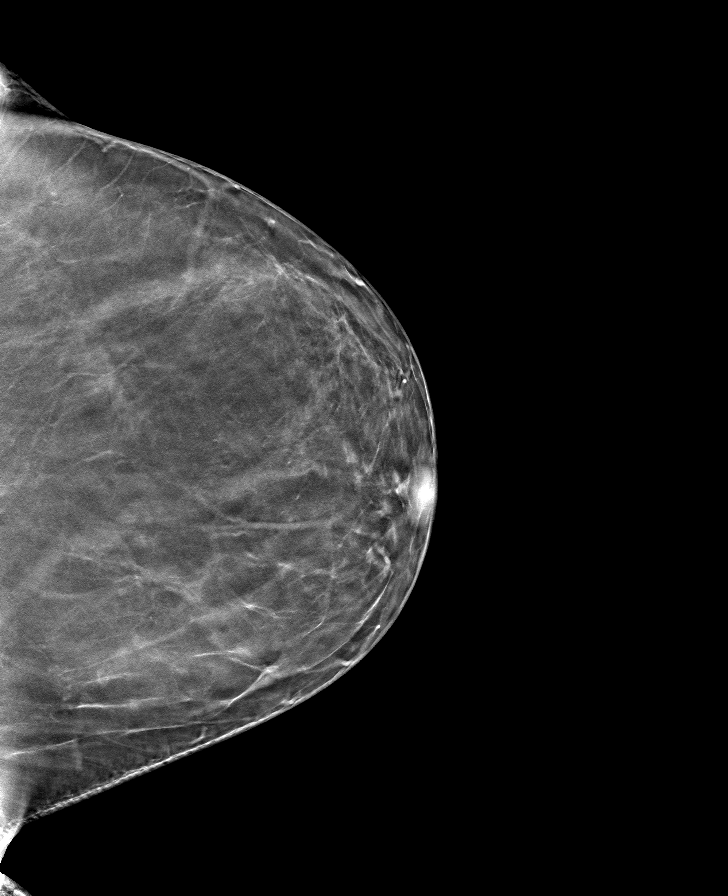

[R MLO tomo · tomo slice 38/75.0]
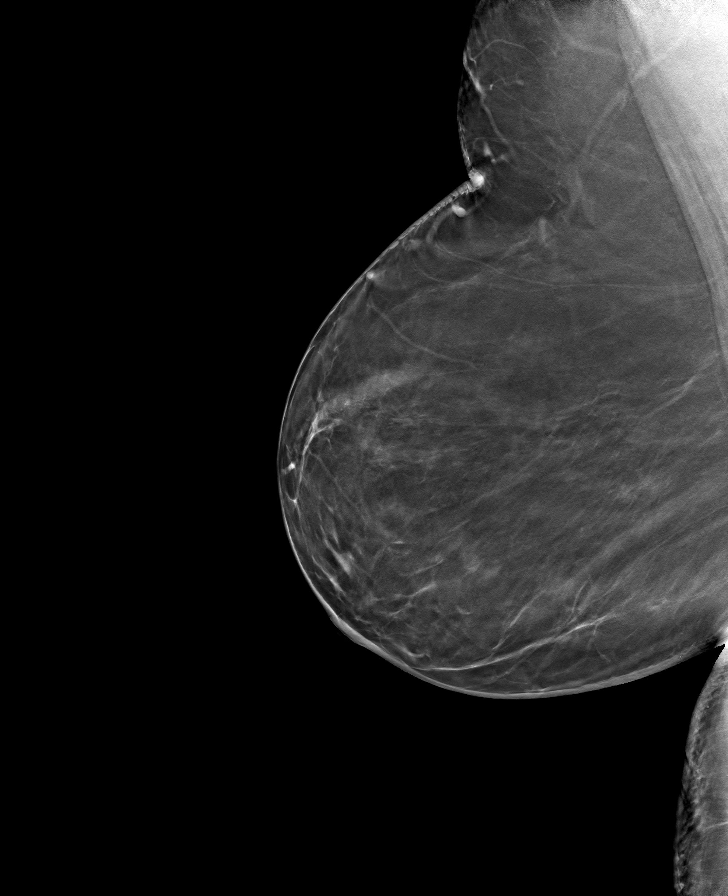

[R CC tomo · tomo slice 33/65.0]
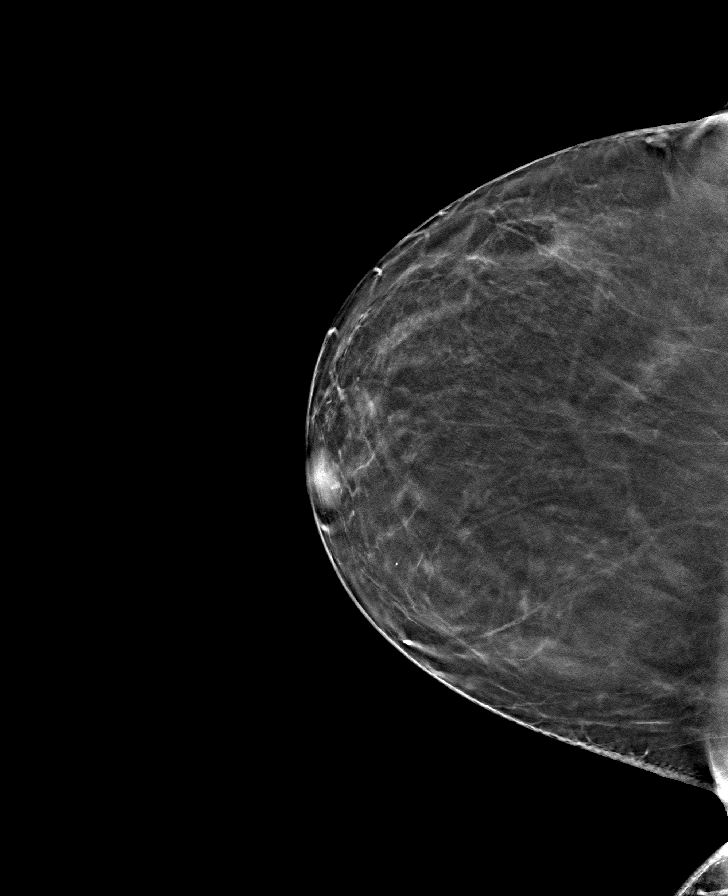

[L MLO tomo · tomo slice 39/77.0]
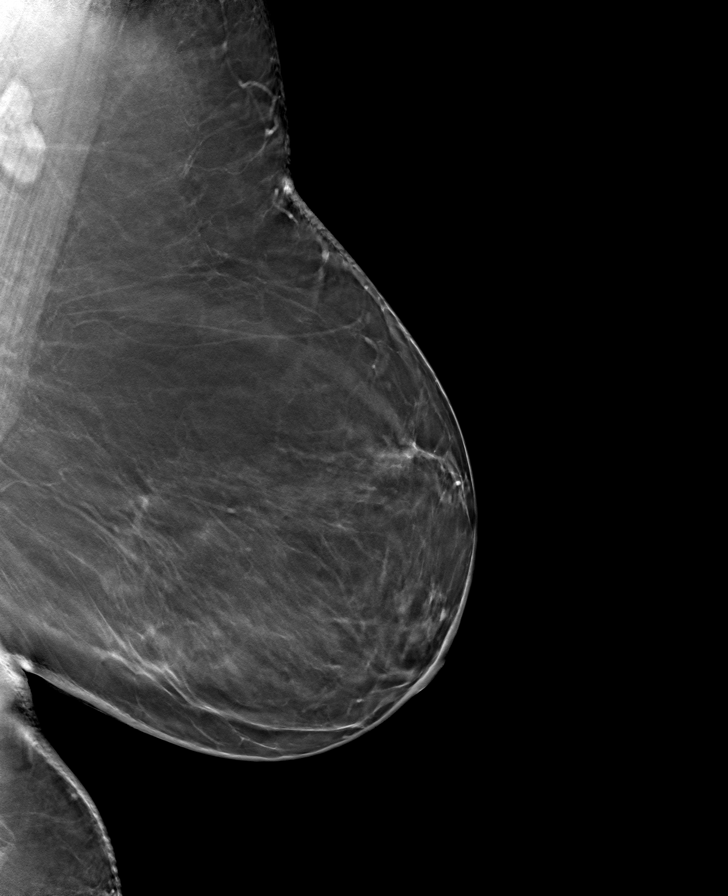

[8 of 24 positions shown; findings below may reference images not displayed]

ACR Breast Density Category b: There are scattered areas of
fibroglandular density.
FINDINGS: There are no findings suspicious for malignancy. Images were
processed with CAD.
IMPRESSION: No mammographic evidence of malignancy. A result letter of this
screening mammogram will be mailed directly to the patient.

RECOMMENDATION:
Screening mammogram in one year. (Code:[id number])

BI-RADS CATEGORY  1: Negative.

## 2021-11-18 ENCOUNTER — Other Ambulatory Visit: Payer: Self-pay | Admitting: Obstetrics and Gynecology

## 2021-11-18 DIAGNOSIS — Z1231 Encounter for screening mammogram for malignant neoplasm of breast: Secondary | ICD-10-CM

## 2021-11-23 ENCOUNTER — Ambulatory Visit
Admission: RE | Admit: 2021-11-23 | Discharge: 2021-11-23 | Disposition: A | Discharge status: Home / Self Care | Payer: BC Managed Care – PPO | Source: Ambulatory Visit | Attending: Obstetrics and Gynecology | Admitting: Obstetrics and Gynecology

## 2021-11-23 ENCOUNTER — Other Ambulatory Visit: Payer: Self-pay

## 2021-11-23 DIAGNOSIS — Z1231 Encounter for screening mammogram for malignant neoplasm of breast: Secondary | ICD-10-CM | POA: Diagnosis not present

## 2021-11-30 ENCOUNTER — Other Ambulatory Visit: Payer: Self-pay

## 2021-11-30 ENCOUNTER — Ambulatory Visit: Payer: BC Managed Care – PPO | Admitting: Dermatology

## 2021-11-30 DIAGNOSIS — L814 Other melanin hyperpigmentation: Secondary | ICD-10-CM | POA: Diagnosis not present

## 2021-11-30 DIAGNOSIS — L821 Other seborrheic keratosis: Secondary | ICD-10-CM

## 2021-11-30 DIAGNOSIS — L578 Other skin changes due to chronic exposure to nonionizing radiation: Secondary | ICD-10-CM | POA: Diagnosis not present

## 2021-11-30 DIAGNOSIS — Z86018 Personal history of other benign neoplasm: Secondary | ICD-10-CM

## 2021-11-30 DIAGNOSIS — D229 Melanocytic nevi, unspecified: Secondary | ICD-10-CM

## 2021-11-30 DIAGNOSIS — Z1283 Encounter for screening for malignant neoplasm of skin: Secondary | ICD-10-CM

## 2021-11-30 DIAGNOSIS — D225 Melanocytic nevi of trunk: Secondary | ICD-10-CM

## 2021-11-30 DIAGNOSIS — I8311 Varicose veins of right lower extremity with inflammation: Secondary | ICD-10-CM

## 2021-11-30 DIAGNOSIS — L719 Rosacea, unspecified: Secondary | ICD-10-CM

## 2021-11-30 MED ORDER — METRONIDAZOLE 0.75 % EX CREA: TOPICAL_CREAM | CUTANEOUS | 3 refills | Status: AC

## 2021-11-30 NOTE — Progress Notes (Signed)
Follow-Up Visit   Subjective  Joann Baxter is a 66 y.o. female who presents for the following: Annual Exam.  Patient presents for TBSE. The patient has spots, moles and lesions to be evaluated, some may be new or changing. She has a history of dysplastic nevi. She has had some breakouts around nose recently.    The following portions of the chart were reviewed this encounter and updated as appropriate:       Review of Systems:  No other skin or systemic complaints except as noted in HPI or Assessment and Plan.  Objective  Well appearing patient in no apparent distress; mood and affect are within normal limits.  A full examination was performed including scalp, head, eyes, ears, nose, lips, neck, chest, axillae, abdomen, back, buttocks, bilateral upper extremities, bilateral lower extremities, hands, feet, fingers, toes, fingernails, and toenails. All findings within normal limits unless otherwise noted below.  trunk, extremities 5x70mm brown papule at left upper arm/shoulder.   78mm medium dark brown papule with darker center at right medial buttock.   2mm medium brown papule at right lateral mid back.   41mm medium dark brown macule at right knee.   7x35mm medium brown macule at left lower hip   75mm brown macule with notch at left pretibial   4.26mm speckled brown macule L mid abd        left upper calf 73mm brown papule slightly waxy   right medial ankle Light violaceous indurated plaque  face Mild erythema with few small pink papules perinasal area.     Assessment & Plan  Skin cancer screening performed today. Actinic Damage - chronic, secondary to cumulative UV radiation exposure/sun exposure over time - diffuse scaly erythematous macules with underlying dyspigmentation - Recommend daily broad spectrum sunscreen SPF 30+ to sun-exposed areas, reapply every 2 hours as needed.  - Recommend staying in the shade or wearing long sleeves, sun glasses (UVA+UVB  protection) and wide brim hats (4-inch brim around the entire circumference of the hat). - Call for new or changing lesions.  Lentigines - Scattered tan macules - Due to sun exposure - Benign-appering, observe - Recommend daily broad spectrum sunscreen SPF 30+ to sun-exposed areas, reapply every 2 hours as needed. - Call for any changes  Seborrheic Keratoses - Stuck-on, waxy, tan-brown papules and/or plaques  - Benign-appearing - Discussed benign etiology and prognosis. - Observe - Call for any changes  History of Dysplastic Nevi - No evidence of recurrence today - Recommend regular full body skin exams - Recommend daily broad spectrum sunscreen SPF 30+ to sun-exposed areas, reapply every 2 hours as needed.  - Call if any new or changing lesions are noted between office visits  Nevus trunk, extremities  Benign-appearing.  Stable. Observation.  Call clinic for new or changing moles.  Recommend daily use of broad spectrum spf 30+ sunscreen to sun-exposed areas.   Photo of left mid abdomen today.   Seborrheic keratosis left upper calf  Reassured benign age-related growth.  Recommend observation.  Discussed cryotherapy if spot(s) become irritated or inflamed.  Lipodermatosclerosis of right lower extremity right medial ankle  Chronic condition with resulting scar-like changes from previous inflammation- stable   Continue compression stockings daily.   Continue diuretic as directed when legs are swelling  Continue mometasone cream qd/bid prn flares.  Topical steroids (such as triamcinolone, fluocinolone, fluocinonide, mometasone, clobetasol, halobetasol, betamethasone, hydrocortisone) can cause thinning and lightening of the skin if they are used for too long in the  same area. Your physician has selected the right strength medicine for your problem and area affected on the body. Please use your medication only as directed by your physician to prevent side effects.   Chronic  inflammatory condition of the subcutaneous fat causing tenderness, discoloration and hardening of the involved skin, most commonly on the lower legs. Discussed that it may progress and gradually worsen over time, especially in the setting of chronic leg swelling. Daily compression stockings/hose is recommended.      Related Medications mometasone (ELOCON) 0.1 % cream Apply QD to BID PRN symptoms  Rosacea face  Rosacea is a chronic progressive skin condition usually affecting the face of adults, causing redness and/or acne bumps. It is treatable but not curable. It sometimes affects the eyes (ocular rosacea) as well. It may respond to topical and/or systemic medication and can flare with stress, sun exposure, alcohol, exercise and some foods.  Daily application of broad spectrum spf 30+ sunscreen to face is recommended to reduce flares.  Start metronidazole 0.75% cream Apply to AA face qd/bid dsp 45g 3Rf.  metroNIDAZOLE (METROCREAM) 0.75 % cream - face Apply to affected areas face once to twice daily for rosacea.   Return in about 1 year (around 11/30/2022) for TBSE.  IJamesetta Orleans, CMA, am acting as scribe for Brendolyn Patty, MD . Documentation: I have reviewed the above documentation for accuracy and completeness, and I agree with the above.  Brendolyn Patty MD

## 2021-11-30 NOTE — Patient Instructions (Addendum)
Rosacea  What is rosacea? Rosacea (say: ro-zay-sha) is a common skin disease that usually begins as a trend of flushing or blushing easily.  As rosacea progresses, a persistent redness in the center of the face will develop and may gradually spread beyond the nose and cheeks to the forehead and chin.  In some cases, the ears, chest, and back could be affected.  Rosacea may appear as tiny blood vessels or small red bumps that occur in crops.  Frequently they can contain pus, and are called pustules.  If the bumps do not contain pus, they are referred to as papules.  Rarely, in prolonged, untreated cases of rosacea, the oil glands of the nose and cheeks may become permanently enlarged.  This is called rhinophyma, and is seen more frequently in men.  Signs and Risks In its beginning stages, rosacea tends to come and go, which makes it difficult to recognize.  It can start as intermittent flushing of the face.  Eventually, blood vessels may become permanently visible.  Pustules and papules can appear, but can be mistaken for adult acne.  People of all races, ages, genders and ethnic groups are at risk of developing rosacea.  However, it is more common in women (especially around menopause) and adults with fair skin between the ages of 90 and 84.  Treatment Dermatologists typically recommend a combination of treatments to effectively manage rosacea.  Treatment can improve symptoms and may stop the progression of the rosacea.  Treatment may involve both topical and oral medications.  The tetracycline antibiotics are often used for their anti-inflammatory effect; however, because of the possibility of developing antibiotic resistance, they should not be used long term at full dose.  For dilated blood vessels the options include electrodessication (uses electric current through a small needle), laser treatment, and cosmetics to hide the redness.   With all forms of treatment, improvement is a slow process, and  patients may not see any results for the first 3-4 weeks.  It is very important to avoid the sun and other triggers.  Patients must wear sunscreen daily.  Skin Care Instructions: Cleanse the skin with a mild soap such as CeraVe cleanser, Cetaphil cleanser, or Dove soap once or twice daily as needed. Moisturize with Eucerin Redness Relief Daily Perfecting Lotion (has a subtle green tint), CeraVe Moisturizing Cream, or Oil of Olay Daily Moisturizer with sunscreen every morning and/or night as recommended. Makeup should be non-comedogenic (wont clog pores) and be labeled for sensitive skin. Good choices for cosmetics are: Neutrogena, Almay, and Physicians Formula.  Any product with a green tint tends to offset a red complexion. If your eyes are dry and irritated, use artificial tears 2-3 times per day and cleanse the eyelids daily with baby shampoo.  Have your eyes examined at least every 2 years.  Be sure to tell your eye doctor that you have rosacea. Alcoholic beverages tend to cause flushing of the skin, and may make rosacea worse. Always wear sunscreen, protect your skin from extreme hot and cold temperatures, and avoid spicy foods, hot drinks, and mechanical irritation such as rubbing, scrubbing, or massaging the face.  Avoid harsh skin cleansers, cleansing masks, astringents, and exfoliation. If a particular product burns or makes your face feel tight, then it is likely to flare your rosacea. If you are having difficulty finding a sunscreen that you can tolerate, you may try switching to a chemical-free sunscreen.  These are ones whose active ingredient is zinc oxide or titanium dioxide  only.  They should also be fragrance free, non-comedogenic, and labeled for sensitive skin. Rosacea triggers may vary from person to person.  There are a variety of foods that have been reported to trigger rosacea.  Some patients find that keeping a diary of what they were doing when they flared helps them avoid  triggers.    Melanoma ABCDEs  Melanoma is the most dangerous type of skin cancer, and is the leading cause of death from skin disease.  You are more likely to develop melanoma if you: Have light-colored skin, light-colored eyes, or red or blond hair Spend a lot of time in the sun Tan regularly, either outdoors or in a tanning bed Have had blistering sunburns, especially during childhood Have a close family member who has had a melanoma Have atypical moles or large birthmarks  Early detection of melanoma is key since treatment is typically straightforward and cure rates are extremely high if we catch it early.   The first sign of melanoma is often a change in a mole or a new dark spot.  The ABCDE system is a way of remembering the signs of melanoma.  A for asymmetry:  The two halves do not match. B for border:  The edges of the growth are irregular. C for color:  A mixture of colors are present instead of an even brown color. D for diameter:  Melanomas are usually (but not always) greater than 56mm - the size of a pencil eraser. E for evolution:  The spot keeps changing in size, shape, and color.  Please check your skin once per month between visits. You can use a small mirror in front and a large mirror behind you to keep an eye on the back side or your body.   If you see any new or changing lesions before your next follow-up, please call to schedule a visit.  Please continue daily skin protection including broad spectrum sunscreen SPF 30+ to sun-exposed areas, reapplying every 2 hours as needed when you're outdoors.   Staying in the shade or wearing long sleeves, sun glasses (UVA+UVB protection) and wide brim hats (4-inch brim around the entire circumference of the hat) are also recommended for sun protection.     If You Need Anything After Your Visit  If you have any questions or concerns for your doctor, please call our main line at [phone] and press option 4 to reach your  doctor's medical assistant. If no one answers, please leave a voicemail as directed and we will return your call as soon as possible. Messages left after 4 pm will be answered the following business day.   You may also send [place] a message via [place]. We typically respond to MyChart messages within 1-2 business days.  For prescription refills, please ask your pharmacy to contact our office. Our fax number is [phone].  If you have an urgent issue when the clinic is closed that cannot wait until the next business day, you can page your doctor at the number below.    Please note that while we do our best to be available for urgent issues outside of office hours, we are not available 24/7.   If you have an urgent issue and are unable to reach [place], you may choose to seek medical care at your doctor's office, retail clinic, urgent care center, or emergency room.  If you have a medical emergency, please immediately call 911 or go to the emergency department.  Pager Numbers  -  Dr. Nehemiah Massed: [phone]  - Dr. Laurence Ferrari: [phone]  - Dr. Nicole Kindred: [phone]  In the event of inclement weather, please call our main line at [phone] for an update on the status of any delays or closures.  Dermatology Medication Tips: Please keep the boxes that topical medications come in in order to help keep track of the instructions about where and how to use these. Pharmacies typically print the medication instructions only on the boxes and not directly on the medication tubes.   If your medication is too expensive, please contact our office at [phone] option 4 or send [place] a message through [place].   We are unable to tell what your co-pay for medications will be in advance as this is different depending on your insurance coverage. However, we may be able to find a substitute medication at lower cost or fill out paperwork to get insurance to cover a needed medication.   If a prior authorization is  required to get your medication covered by your insurance company, please allow [place] 1-2 business days to complete this process.  Drug prices often vary depending on where the prescription is filled and some pharmacies may offer cheaper prices.  The website www.goodrx.com contains coupons for medications through different pharmacies. The prices here do not account for what the cost may be with help from insurance (it may be cheaper with your insurance), but the website can give you the price if you did not use any insurance.  - You can print the associated coupon and take it with your prescription to the pharmacy.  - You may also stop by our office during regular business hours and pick up a GoodRx coupon card.  - If you need your prescription sent electronically to a different pharmacy, notify our office through [facility] or by phone at [phone] option 4.     Si Usted Necesita Algo Despus de Su Visita  Tambin puede enviarnos un mensaje a travs de Pharmacist, community. Por lo general respondemos a los mensajes de MyChart en el transcurso de 1 a 2 das hbiles.  Para renovar recetas, por favor pida a su farmacia que se ponga en contacto con nuestra oficina. Harland Dingwall de fax es [place] [phone].  Si tiene un asunto urgente cuando la clnica est cerrada y que no puede esperar hasta el siguiente da hbil, puede llamar/localizar a su doctor(a) al nmero que aparece a continuacin.   Por favor, tenga en cuenta que aunque hacemos todo lo posible para estar disponibles para asuntos urgentes fuera del horario de [place], no estamos disponibles las 24 horas del da, los 7 das de la [place].   Si tiene un problema urgente y no puede comunicarse con nosotros, puede optar por buscar atencin mdica  en el consultorio de su doctor(a), en una clnica privada, en un centro de atencin urgente o en una sala de emergencias.  Si tiene Engineering geologist, por favor llame inmediatamente al 911 o vaya a  la sala de emergencias.  Nmeros de bper  - Dr. Nehemiah Massed: [phone]  - Dra. Moye: [phone]  - Dra. Nicole Kindred: [phone]  En caso de inclemencias del [place], por favor llame a Johnsie Kindred principal al [phone] para una actualizacin sobre el [place] de cualquier retraso o cierre.  Consejos para la medicacin en dermatologa: Por favor, guarde las cajas en las que vienen los medicamentos de uso tpico para ayudarle a seguir las instrucciones sobre dnde y cmo usarlos. [place] instrucciones del medicamento  slo en las cajas y no directamente en los tubos del medicamento.   Si su medicamento es muy caro, por favor, pngase en contacto con Zigmund Daniel llamando al [phone] y presione la opcin 4 o envenos un mensaje a travs de Pharmacist, community.   No podemos decirle cul ser su copago por los medicamentos por adelantado ya que esto es diferente dependiendo de la cobertura de su seguro. Sin embargo, es posible que podamos encontrar un medicamento sustituto a Electrical engineer un formulario para que el seguro cubra el medicamento que se considera necesario.   Si se requiere una autorizacin previa para que su compaa de seguros [place] su medicamento, por favor permtanos de 1 a 2 das hbiles para completar este proceso.  Los precios de los medicamentos varan con frecuencia dependiendo del Environmental consultant de dnde se surte la receta y alguna farmacias pueden ofrecer precios ms baratos.  El sitio web www.goodrx.com tiene cupones para medicamentos de Airline pilot. Los precios aqu no tienen en cuenta lo que podra costar con la ayuda del seguro (puede ser ms barato con su seguro), pero el sitio web puede darle el precio si no utiliz Research scientist (physical sciences).  - Puede imprimir el cupn correspondiente y llevarlo con su receta a la farmacia.  - Tambin puede pasar por nuestra oficina durante el horario de atencin regular y Charity fundraiser una tarjeta de cupones de  GoodRx.  - Si necesita que su receta se enve electrnicamente a una farmacia diferente, informe a nuestra oficina a travs de MyChart de [place] o por telfono llamando al [phone] y presione la opcin 4.

## 2022-12-06 ENCOUNTER — Ambulatory Visit: Payer: BC Managed Care – PPO | Admitting: Dermatology

## 2022-12-06 VITALS — BP 112/61 | HR 71

## 2022-12-06 DIAGNOSIS — Z1283 Encounter for screening for malignant neoplasm of skin: Secondary | ICD-10-CM | POA: Diagnosis not present

## 2022-12-06 DIAGNOSIS — L719 Rosacea, unspecified: Secondary | ICD-10-CM

## 2022-12-06 DIAGNOSIS — D2261 Melanocytic nevi of right upper limb, including shoulder: Secondary | ICD-10-CM | POA: Diagnosis not present

## 2022-12-06 DIAGNOSIS — M793 Panniculitis, unspecified: Secondary | ICD-10-CM

## 2022-12-06 DIAGNOSIS — D229 Melanocytic nevi, unspecified: Secondary | ICD-10-CM

## 2022-12-06 DIAGNOSIS — L814 Other melanin hyperpigmentation: Secondary | ICD-10-CM

## 2022-12-06 DIAGNOSIS — D225 Melanocytic nevi of trunk: Secondary | ICD-10-CM | POA: Diagnosis not present

## 2022-12-06 DIAGNOSIS — D2272 Melanocytic nevi of left lower limb, including hip: Secondary | ICD-10-CM

## 2022-12-06 DIAGNOSIS — L821 Other seborrheic keratosis: Secondary | ICD-10-CM

## 2022-12-06 DIAGNOSIS — L918 Other hypertrophic disorders of the skin: Secondary | ICD-10-CM

## 2022-12-06 DIAGNOSIS — D2271 Melanocytic nevi of right lower limb, including hip: Secondary | ICD-10-CM

## 2022-12-06 DIAGNOSIS — D2262 Melanocytic nevi of left upper limb, including shoulder: Secondary | ICD-10-CM

## 2022-12-06 DIAGNOSIS — Z86018 Personal history of other benign neoplasm: Secondary | ICD-10-CM

## 2022-12-06 DIAGNOSIS — L578 Other skin changes due to chronic exposure to nonionizing radiation: Secondary | ICD-10-CM

## 2022-12-06 NOTE — Patient Instructions (Addendum)
     Melanoma ABCDEs  Melanoma is the most dangerous type of skin cancer, and is the leading cause of death from skin disease.  You are more likely to develop melanoma if you: Have light-colored skin, light-colored eyes, or red or blond hair Spend a lot of time in the sun Tan regularly, either outdoors or in a tanning bed Have had blistering sunburns, especially during childhood Have a close family member who has had a melanoma Have atypical moles or large birthmarks  Early detection of melanoma is key since treatment is typically straightforward and cure rates are extremely high if we catch it early.   The first sign of melanoma is often a change in a mole or a new dark spot.  The ABCDE system is a way of remembering the signs of melanoma.  A for asymmetry:  The two halves do not match. B for border:  The edges of the growth are irregular. C for color:  A mixture of colors are present instead of an even brown color. D for diameter:  Melanomas are usually (but not always) greater than 6mm - the size of a pencil eraser. E for evolution:  The spot keeps changing in size, shape, and color.  Please check your skin once per month between visits. You can use a small mirror in front and a large mirror behind you to keep an eye on the back side or your body.   If you see any new or changing lesions before your next follow-up, please call to schedule a visit.  Please continue daily skin protection including broad spectrum sunscreen SPF 30+ to sun-exposed areas, reapplying every 2 hours as needed when you're outdoors.   Staying in the shade or wearing long sleeves, sun glasses (UVA+UVB protection) and wide brim hats (4-inch brim around the entire circumference of the hat) are also recommended for sun protection.    Due to recent changes in healthcare laws, you may see results of your pathology and/or laboratory studies on MyChart before the doctors have had a chance to review them. We  understand that in some cases there may be results that are confusing or concerning to you. Please understand that not all results are received at the same time and often the doctors may need to interpret multiple results in order to provide you with the best plan of care or course of treatment. Therefore, we ask that you please give us 2 business days to thoroughly review all your results before contacting the office for clarification. Should we see a critical lab result, you will be contacted sooner.   If You Need Anything After Your Visit  If you have any questions or concerns for your doctor, please call our main line at [phone] and press option 4 to reach your doctor's medical assistant. If no one answers, please leave a voicemail as directed and we will return your call as soon as possible. Messages left after 4 pm will be answered the following business day.   You may also send us a message via MyChart. We typically respond to MyChart messages within 1-2 business days.  For prescription refills, please ask your pharmacy to contact our office. Our fax number is [phone].  If you have an urgent issue when the clinic is closed that cannot wait until the next business day, you can page your doctor at the number below.    Please note that while we do our best to be available for urgent issues   outside of office hours, we are not available 24/7.   If you have an urgent issue and are unable to reach us, you may choose to seek medical care at your doctor's office, retail clinic, urgent care center, or emergency room.  If you have a medical emergency, please immediately call 911 or go to the emergency department.  Pager Numbers  - Dr. Kowalski: [phone]  - Dr. Moye: [phone]  - Dr. Stewart: [phone]  In the event of inclement weather, please call our main line at [phone] for an update on the status of any delays or closures.  Dermatology Medication Tips: Please  keep the boxes that topical medications come in in order to help keep track of the instructions about where and how to use these. Pharmacies typically print the medication instructions only on the boxes and not directly on the medication tubes.   If your medication is too expensive, please contact our office at [phone] option 4 or send us a message through MyChart.   We are unable to tell what your co-pay for medications will be in advance as this is different depending on your insurance coverage. However, we may be able to find a substitute medication at lower cost or fill out paperwork to get insurance to cover a needed medication.   If a prior authorization is required to get your medication covered by your insurance company, please allow us 1-2 business days to complete this process.  Drug prices often vary depending on where the prescription is filled and some pharmacies may offer cheaper prices.  The website www.goodrx.com contains coupons for medications through different pharmacies. The prices here do not account for what the cost may be with help from insurance (it may be cheaper with your insurance), but the website can give you the price if you did not use any insurance.  - You can print the associated coupon and take it with your prescription to the pharmacy.  - You may also stop by our office during regular business hours and pick up a GoodRx coupon card.  - If you need your prescription sent electronically to a different pharmacy, notify our office through [place] MyChart or by phone at [phone] option 4.     Si Usted Necesita Algo Despus de Su Visita  Tambin puede enviarnos un mensaje a travs de MyChart. Por lo general respondemos a los mensajes de MyChart en el transcurso de 1 a 2 das hbiles.  Para renovar recetas, por favor pida a su farmacia que se ponga en contacto con nuestra oficina. Nuestro nmero de fax es el [phone].  Si tiene un asunto urgente  cuando la clnica est cerrada y que no puede esperar hasta el siguiente da hbil, puede llamar/localizar a su doctor(a) al nmero que aparece a continuacin.   Por favor, tenga en cuenta que aunque hacemos todo lo posible para estar disponibles para asuntos urgentes fuera del horario de oficina, no estamos disponibles las 24 horas del da, los 7 das de la semana.   Si tiene un problema urgente y no puede comunicarse con nosotros, puede optar por buscar atencin mdica  en el consultorio de su doctor(a), en una clnica privada, en un centro de atencin urgente o en una sala de emergencias.  Si tiene una emergencia mdica, por favor llame inmediatamente al 911 o vaya a la sala de emergencias.  Nmeros de bper  - Dr. Kowalski: [phone]  - Dra. Moye: [phone]  - Dra. Stewart: [phone]  En caso   de inclemencias del tiempo, por favor llame a nuestra lnea principal al [phone] para una actualizacin sobre el estado de cualquier retraso o cierre.  Consejos para la medicacin en dermatologa: Por favor, guarde las cajas en las que vienen los medicamentos de uso tpico para ayudarle a seguir las instrucciones sobre dnde y cmo usarlos. Las farmacias generalmente imprimen las instrucciones del medicamento slo en las cajas y no directamente en los tubos del medicamento.   Si su medicamento es muy caro, por favor, pngase en contacto con nuestra oficina llamando al [phone] y presione la opcin 4 o envenos un mensaje a travs de MyChart.   No podemos decirle cul ser su copago por los medicamentos por adelantado ya que esto es diferente dependiendo de la cobertura de su seguro. Sin embargo, es posible que podamos encontrar un medicamento sustituto a menor costo o llenar un formulario para que el seguro cubra el medicamento que se considera necesario.   Si se requiere una autorizacin previa para que su compaa de seguros cubra su medicamento, por favor permtanos de 1 a 2  das hbiles para completar este proceso.  Los precios de los medicamentos varan con frecuencia dependiendo del lugar de dnde se surte la receta y alguna farmacias pueden ofrecer precios ms baratos.  El sitio web www.goodrx.com tiene cupones para medicamentos de diferentes farmacias. Los precios aqu no tienen en cuenta lo que podra costar con la ayuda del seguro (puede ser ms barato con su seguro), pero el sitio web puede darle el precio si no utiliz ningn seguro.  - Puede imprimir el cupn correspondiente y llevarlo con su receta a la farmacia.  - Tambin puede pasar por nuestra oficina durante el horario de atencin regular y recoger una tarjeta de cupones de GoodRx.  - Si necesita que su receta se enve electrnicamente a una farmacia diferente, informe a nuestra oficina a travs de MyChart de [place] o por telfono llamando al [phone] y presione la opcin 4.  

## 2022-12-06 NOTE — Progress Notes (Signed)
Follow-Up Visit   Subjective  Joann Baxter is a 67 y.o. female who presents for the following: Total body skin exam (Hx of Dysplastic Nevi), Rosacea (Face, Metronidazole 0.75% cr prn), and Lipodermatosclerosis (R med ankle, not using Mometasone cr).  The patient presents for Total-Body Skin Exam (TBSE) for skin cancer screening and mole check.  The patient has spots, moles and lesions to be evaluated, some may be new or changing and the patient has concerns that these could be cancer.   The following portions of the chart were reviewed this encounter and updated as appropriate:       Review of Systems:  No other skin or systemic complaints except as noted in HPI or Assessment and Plan.  Objective  Well appearing patient in no apparent distress; mood and affect are within normal limits.  A full examination was performed including scalp, head, eyes, ears, nose, lips, neck, chest, axillae, abdomen, back, buttocks, bilateral upper extremities, bilateral lower extremities, hands, feet, fingers, toes, fingernails, and toenails. All findings within normal limits unless otherwise noted below.  trunk, arms, legs 5x51m brown papule at left upper arm/shoulder.   466mmedium dark brown papule with darker center at right medial buttock.   3m69medium brown papule at right lateral mid back.   5mm69mdium dark brown macule at right knee.   7x3mm 50mium brown macule at left hip   3mm b47mn macule with notch at left pretibial    4.0mm sp81mled brown macule L mid abd  3.0mm bro3mmacule with adjacent 1.3 waxy tan patch L upper arm with SK         L upper calf 3mm scal27mray/brown stuck on papule  R medial ankle Light violaceous indurated plaque  Head - Anterior (Face) Small inflammatory papules nose x 2    Assessment & Plan   Lentigines - Scattered tan macules - Due to sun exposure - Benign-appearing, observe - Recommend daily broad spectrum sunscreen SPF 30+ to sun-exposed  areas, reapply every 2 hours as needed. - Call for any changes - back, arms  Seborrheic Keratoses - Stuck-on, waxy, tan-brown papules and/or plaques  - Benign-appearing - Discussed benign etiology and prognosis. - Observe - Call for any changes - back  Melanocytic Nevi - Tan-brown and/or pink-flesh-colored symmetric macules and papules - Benign appearing on exam today - Observation - Call clinic for new or changing moles - Recommend daily use of broad spectrum spf 30+ sunscreen to sun-exposed areas.  - back, arms, legs  Hemangiomas - Red papules - Discussed benign nature - Observe - Call for any changes - trunk, chest  Actinic Damage - Chronic condition, secondary to cumulative UV/sun exposure - diffuse scaly erythematous macules with underlying dyspigmentation - Recommend daily broad spectrum sunscreen SPF 30+ to sun-exposed areas, reapply every 2 hours as needed.  - Staying in the shade or wearing long sleeves, sun glasses (UVA+UVB protection) and wide brim hats (4-inch brim around the entire circumference of the hat) are also recommended for sun protection.  - Call for new or changing lesions. - chest  Skin cancer screening performed today.   History of Dysplastic Nevi - No evidence of recurrence today - Recommend regular full body skin exams - Recommend daily broad spectrum sunscreen SPF 30+ to sun-exposed areas, reapply every 2 hours as needed.  - Call if any new or changing lesions are noted between office visits  -R back, Mid back, L upper thigh, L spinal lower back  Acrochordons (Skin  Tags) - Fleshy, skin-colored pedunculated papules - Benign appearing.  - Observe. - If desired, they can be removed with an in office procedure that is not covered by insurance. - Please call the clinic if you notice any new or changing lesions.   Nevus trunk, arms, legs  Benign-appearing. Stable compared to previous visit. Observation.  Call clinic for new or changing  moles.  Recommend daily use of broad spectrum spf 30+ sunscreen to sun-exposed areas.    Seborrheic keratosis L upper calf  Reassured benign age-related growth.  Recommend observation.  Discussed cryotherapy if spot(s) become irritated or inflamed.   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 Lipodermatosclerosis is a chronic inflammatory condition of unknown cause of the subcutaneous fat causing tenderness, discoloration and hardening of the involved skin, most commonly on the lower legs. Discussed that it may progress and gradually worsen over time, especially in the setting of chronic leg swelling. Daily compression stockings/hose is recommended.   Stable, observe  Cont compression stockings daily Cont Diuretic as directed when legs are swelling Cont Mometasone cr qd/bid prn flares  Topical steroids (such as triamcinolone, fluocinolone, fluocinonide, mometasone, clobetasol, halobetasol, betamethasone, hydrocortisone) can cause thinning and lightening of the skin if they are used for too long in the same area. Your physician has selected the right strength medicine for your problem and area affected on the body. Please use your medication only as directed by your physician to prevent side effects.    Related Medications mometasone (ELOCON) 0.1 % cream Apply QD to BID PRN symptoms  Rosacea Head - Anterior (Face)  Chronic and persistent condition with duration or expected duration over one year. Condition is symptomatic/ bothersome to patient. Not currently at goal.   Rosacea is a chronic progressive skin condition usually affecting the face of adults, causing redness and/or acne bumps. It is treatable but not curable. It sometimes affects the eyes (ocular rosacea) as well. It may respond to topical and/or systemic medication and can flare with stress, sun exposure, alcohol, exercise,  topical steroids (including hydrocortisone/cortisone 10) and some foods.  Daily application of broad spectrum spf 30+ sunscreen to face is recommended to reduce flares.  Cont Metronidzole 0.725% cr qd or qd prn flares  Related Medications metroNIDAZOLE (METROCREAM) 0.75 % cream Apply to affected areas face once to twice daily for rosacea.   Return in about 1 year (around 12/07/2023) for TBSE, Hx of Dysplastic nevi.  I, Othelia Pulling, RMA, am acting as scribe for Brendolyn Patty, MD .  Documentation: I have reviewed the above documentation for accuracy and completeness, and I agree with the above.  Brendolyn Patty MD

## 2023-02-18 ENCOUNTER — Other Ambulatory Visit: Payer: Self-pay

## 2023-02-18 DIAGNOSIS — Z1231 Encounter for screening mammogram for malignant neoplasm of breast: Secondary | ICD-10-CM

## 2023-02-25 ENCOUNTER — Ambulatory Visit
Admission: RE | Admit: 2023-02-25 | Discharge: 2023-02-25 | Disposition: A | Discharge status: Home / Self Care | Payer: BC Managed Care – PPO | Source: Ambulatory Visit | Attending: Family Medicine | Admitting: Family Medicine

## 2023-02-25 DIAGNOSIS — Z1231 Encounter for screening mammogram for malignant neoplasm of breast: Secondary | ICD-10-CM | POA: Insufficient documentation

## 2023-12-13 ENCOUNTER — Encounter: Payer: BC Managed Care – PPO | Admitting: Dermatology

## 2023-12-28 ENCOUNTER — Encounter: Payer: Self-pay | Admitting: Dermatology

## 2023-12-28 ENCOUNTER — Ambulatory Visit (INDEPENDENT_AMBULATORY_CARE_PROVIDER_SITE_OTHER): Payer: PRIVATE HEALTH INSURANCE | Admitting: Dermatology

## 2023-12-28 DIAGNOSIS — D1801 Hemangioma of skin and subcutaneous tissue: Secondary | ICD-10-CM

## 2023-12-28 DIAGNOSIS — L821 Other seborrheic keratosis: Secondary | ICD-10-CM

## 2023-12-28 DIAGNOSIS — W908XXA Exposure to other nonionizing radiation, initial encounter: Secondary | ICD-10-CM

## 2023-12-28 DIAGNOSIS — D229 Melanocytic nevi, unspecified: Secondary | ICD-10-CM

## 2023-12-28 DIAGNOSIS — L719 Rosacea, unspecified: Secondary | ICD-10-CM

## 2023-12-28 DIAGNOSIS — D2272 Melanocytic nevi of left lower limb, including hip: Secondary | ICD-10-CM

## 2023-12-28 DIAGNOSIS — D2262 Melanocytic nevi of left upper limb, including shoulder: Secondary | ICD-10-CM

## 2023-12-28 DIAGNOSIS — Z1283 Encounter for screening for malignant neoplasm of skin: Secondary | ICD-10-CM

## 2023-12-28 DIAGNOSIS — L814 Other melanin hyperpigmentation: Secondary | ICD-10-CM | POA: Diagnosis not present

## 2023-12-28 DIAGNOSIS — L918 Other hypertrophic disorders of the skin: Secondary | ICD-10-CM

## 2023-12-28 DIAGNOSIS — D2271 Melanocytic nevi of right lower limb, including hip: Secondary | ICD-10-CM

## 2023-12-28 DIAGNOSIS — D225 Melanocytic nevi of trunk: Secondary | ICD-10-CM

## 2023-12-28 DIAGNOSIS — L578 Other skin changes due to chronic exposure to nonionizing radiation: Secondary | ICD-10-CM | POA: Diagnosis not present

## 2023-12-28 DIAGNOSIS — Z808 Family history of malignant neoplasm of other organs or systems: Secondary | ICD-10-CM

## 2023-12-28 DIAGNOSIS — Z86018 Personal history of other benign neoplasm: Secondary | ICD-10-CM

## 2023-12-28 NOTE — Progress Notes (Signed)
Follow-Up Visit   Subjective  Joann Baxter is a 68 y.o. female who presents for the following: Skin Cancer Screening and Full Body Skin Exam hx of Dysplastic nevi, check mole L calf has gotten rough, Rosacea face Metronidazole cr prn  The patient presents for Total-Body Skin Exam (TBSE) for skin cancer screening and mole check. The patient has spots, moles and lesions to be evaluated, some may be new or changing and the patient may have concern these could be cancer.    The following portions of the chart were reviewed this encounter and updated as appropriate: medications, allergies, medical history  Review of Systems:  No other skin or systemic complaints except as noted in HPI or Assessment and Plan.  Objective  Well appearing patient in no apparent distress; mood and affect are within normal limits.  A full examination was performed including scalp, head, eyes, ears, nose, lips, neck, chest, axillae, abdomen, back, buttocks, bilateral upper extremities, bilateral lower extremities, hands, feet, fingers, toes, fingernails, and toenails. All findings within normal limits unless otherwise noted below.   Relevant physical exam findings are noted in the Assessment and Plan.    Assessment & Plan   SKIN CANCER SCREENING PERFORMED TODAY.  ACTINIC DAMAGE - Chronic condition, secondary to cumulative UV/sun exposure - diffuse scaly erythematous macules with underlying dyspigmentation - Recommend daily broad spectrum sunscreen SPF 30+ to sun-exposed areas, reapply every 2 hours as needed.  - Staying in the shade or wearing long sleeves, sun glasses (UVA+UVB protection) and wide brim hats (4-inch brim around the entire circumference of the hat) are also recommended for sun protection.  - Call for new or changing lesions.  LENTIGINES, SEBORRHEIC KERATOSES, HEMANGIOMAS - Benign normal skin lesions - Benign-appearing - Call for any changes - L nasal root hemangioma  MELANOCYTIC NEVI -  Tan-brown and/or pink-flesh-colored symmetric macules and papules - Left upper arm/shoulder 5x46mm brown papule - Right medial buttock 4mm medium dark brown papule with darker center  - Right lateral mid back 4mm medium brown papule    - Right knee 5mm medium dark brown macule   - Left hip 7x23mm speckled medium brown macule  - Left pretibial 4mm brown macule with notch  - L mid abd 4.9mm speckled brown macule  - L upper arm with SK 3.69mm brown macule with adjacent 1.3 waxy tan patch  - Benign appearing on exam today. Stable compared to previous visit. - Observation - Call clinic for new or changing moles - Recommend daily use of broad spectrum spf 30+ sunscreen to sun-exposed areas.    SEBORRHEIC KERATOSIS L upper calf Exam: 4mm scaly gray/brown stuck on papule   Treatment Plan: Benign-appearing. Stable compared to previous visit. Observation.  Call clinic for new or changing moles.  Recommend daily use of broad spectrum spf 30+ sunscreen to sun-exposed areas.   Acrochordons (Skin Tags) abdomen - Fleshy, skin-colored pedunculated papules - Benign appearing.  - Observe. - If desired, they can be removed with an in office procedure that is not covered by insurance. - Please call the clinic if you notice any new or changing lesions.   HISTORY OF DYSPLASTIC NEVUS No evidence of recurrence today- R back, Mid back, L upper thigh, L spinal lower back Recommend regular full body skin exams Recommend daily broad spectrum sunscreen SPF 30+ to sun-exposed areas, reapply every 2 hours as needed.  Call if any new or changing lesions are noted between office visits   ROSACEA Exam Mid face erythema  with telangiectasias   Chronic and persistent condition with duration or expected duration over one year. Condition is symptomatic / bothersome to patient. Not to goal.   Rosacea is a chronic progressive skin condition usually affecting the face of adults, causing redness and/or acne bumps. It  is treatable but not curable. It sometimes affects the eyes (ocular rosacea) as well. It may respond to topical and/or systemic medication and can flare with stress, sun exposure, alcohol, exercise, topical steroids (including hydrocortisone/cortisone 10) and some foods.  Daily application of broad spectrum spf 30+ sunscreen to face is recommended to reduce flares.  Patient denies grittiness of the eyes  Treatment Plan Mild, no treatment today  FAMILY HISTORY OF SKIN CANCER What type(s):Melanoma Who affected:father    Return in about 1 year (around 12/27/2024) for TBSE, Hx of Dysplastic nevi.  I, Ardis Rowan, RMA, am acting as scribe for Willeen Niece, MD .   Documentation: I have reviewed the above documentation for accuracy and completeness, and I agree with the above.  Willeen Niece, MD

## 2023-12-28 NOTE — Patient Instructions (Addendum)

## 2024-05-31 ENCOUNTER — Other Ambulatory Visit: Payer: Self-pay | Admitting: Obstetrics and Gynecology

## 2024-05-31 DIAGNOSIS — Z1231 Encounter for screening mammogram for malignant neoplasm of breast: Secondary | ICD-10-CM

## 2024-06-14 ENCOUNTER — Ambulatory Visit
Admission: RE | Admit: 2024-06-14 | Discharge: 2024-06-14 | Disposition: A | Discharge status: Home / Self Care | Payer: PRIVATE HEALTH INSURANCE | Source: Ambulatory Visit | Attending: Obstetrics and Gynecology | Admitting: Obstetrics and Gynecology

## 2024-06-14 DIAGNOSIS — Z1231 Encounter for screening mammogram for malignant neoplasm of breast: Secondary | ICD-10-CM | POA: Diagnosis present

## 2025-01-01 ENCOUNTER — Ambulatory Visit: Payer: PRIVATE HEALTH INSURANCE | Admitting: Dermatology
